# Patient Record
Sex: Female | Born: 1945 | Race: White | Hispanic: No | Marital: Married | Smoking: Never smoker
Health system: Southern US, Community
[De-identification: ages and names within clinical notes are randomized; demographics above are authoritative.]

## PROBLEM LIST (undated history)

## (undated) DIAGNOSIS — D510 Vitamin B12 deficiency anemia due to intrinsic factor deficiency: Secondary | ICD-10-CM

## (undated) HISTORY — DX: Vitamin B12 deficiency anemia due to intrinsic factor deficiency: D51.0

---

## 1998-05-19 ENCOUNTER — Other Ambulatory Visit: Admission: RE | Admit: 1998-05-19 | Discharge: 1998-05-19 | Payer: Self-pay | Admitting: *Deleted

## 1999-08-02 ENCOUNTER — Other Ambulatory Visit: Admission: RE | Admit: 1999-08-02 | Discharge: 1999-08-02 | Payer: Self-pay | Admitting: *Deleted

## 2000-08-22 ENCOUNTER — Encounter: Payer: Self-pay | Admitting: *Deleted

## 2000-08-22 ENCOUNTER — Encounter: Admission: RE | Admit: 2000-08-22 | Discharge: 2000-08-22 | Payer: Self-pay | Admitting: *Deleted

## 2000-09-05 ENCOUNTER — Other Ambulatory Visit: Admission: RE | Admit: 2000-09-05 | Discharge: 2000-09-05 | Payer: Self-pay | Admitting: *Deleted

## 2001-09-18 ENCOUNTER — Encounter: Payer: Self-pay | Admitting: *Deleted

## 2001-09-18 ENCOUNTER — Encounter: Admission: RE | Admit: 2001-09-18 | Discharge: 2001-09-18 | Payer: Self-pay | Admitting: *Deleted

## 2001-10-02 ENCOUNTER — Other Ambulatory Visit: Admission: RE | Admit: 2001-10-02 | Discharge: 2001-10-02 | Payer: Self-pay | Admitting: *Deleted

## 2002-11-05 ENCOUNTER — Encounter: Admission: RE | Admit: 2002-11-05 | Discharge: 2002-11-05 | Payer: Self-pay | Admitting: *Deleted

## 2002-11-05 ENCOUNTER — Encounter: Payer: Self-pay | Admitting: *Deleted

## 2002-11-18 ENCOUNTER — Other Ambulatory Visit: Admission: RE | Admit: 2002-11-18 | Discharge: 2002-11-18 | Payer: Self-pay | Admitting: *Deleted

## 2004-01-05 ENCOUNTER — Encounter: Admission: RE | Admit: 2004-01-05 | Discharge: 2004-01-05 | Payer: Self-pay | Admitting: *Deleted

## 2004-01-20 ENCOUNTER — Other Ambulatory Visit: Admission: RE | Admit: 2004-01-20 | Discharge: 2004-01-20 | Payer: Self-pay | Admitting: *Deleted

## 2005-02-08 ENCOUNTER — Encounter: Admission: RE | Admit: 2005-02-08 | Discharge: 2005-02-08 | Payer: Self-pay | Admitting: *Deleted

## 2005-02-22 ENCOUNTER — Other Ambulatory Visit: Admission: RE | Admit: 2005-02-22 | Discharge: 2005-02-22 | Payer: Self-pay | Admitting: *Deleted

## 2006-04-04 ENCOUNTER — Encounter: Admission: RE | Admit: 2006-04-04 | Discharge: 2006-04-04 | Payer: Self-pay | Admitting: *Deleted

## 2006-04-18 ENCOUNTER — Other Ambulatory Visit: Admission: RE | Admit: 2006-04-18 | Discharge: 2006-04-18 | Payer: Self-pay | Admitting: *Deleted

## 2006-06-27 ENCOUNTER — Other Ambulatory Visit: Admission: RE | Admit: 2006-06-27 | Discharge: 2006-06-27 | Payer: Self-pay | Admitting: *Deleted

## 2007-01-09 ENCOUNTER — Other Ambulatory Visit: Admission: RE | Admit: 2007-01-09 | Discharge: 2007-01-09 | Payer: Self-pay | Admitting: *Deleted

## 2007-03-13 ENCOUNTER — Inpatient Hospital Stay (HOSPITAL_COMMUNITY): Admission: EM | Admit: 2007-03-13 | Discharge: 2007-03-17 | Payer: Self-pay | Admitting: Gastroenterology

## 2007-03-13 ENCOUNTER — Ambulatory Visit: Payer: Self-pay | Admitting: Oncology

## 2007-06-05 ENCOUNTER — Encounter: Admission: RE | Admit: 2007-06-05 | Discharge: 2007-06-05 | Payer: Self-pay | Admitting: *Deleted

## 2007-06-26 ENCOUNTER — Other Ambulatory Visit: Admission: RE | Admit: 2007-06-26 | Discharge: 2007-06-26 | Payer: Self-pay | Admitting: *Deleted

## 2007-08-07 ENCOUNTER — Ambulatory Visit: Payer: Self-pay | Admitting: Oncology

## 2008-07-15 ENCOUNTER — Encounter: Admission: RE | Admit: 2008-07-15 | Discharge: 2008-07-15 | Payer: Self-pay | Admitting: Gynecology

## 2008-07-29 ENCOUNTER — Other Ambulatory Visit: Admission: RE | Admit: 2008-07-29 | Discharge: 2008-07-29 | Payer: Self-pay | Admitting: Gynecology

## 2009-10-14 ENCOUNTER — Encounter: Admission: RE | Admit: 2009-10-14 | Discharge: 2009-10-14 | Payer: Self-pay | Admitting: Internal Medicine

## 2010-11-09 ENCOUNTER — Encounter
Admission: RE | Admit: 2010-11-09 | Discharge: 2010-11-09 | Payer: Self-pay | Source: Home / Self Care | Attending: Internal Medicine | Admitting: Internal Medicine

## 2010-11-14 IMAGING — MG MM SCREEN MAMMOGRAM BILATERAL
5 series · 5 of 5 positions shown · non-contrast
Comparison: none

DG SCREEN MAMMOGRAM BILATERAL
Bilateral CC and MLO view(s) were taken.

DIGITAL SCREENING MAMMOGRAM WITH CAD:
There are scattered fibroglandular densities.  No masses or malignant type calcifications are 
identified.  Compared with prior studies.
Images were processed with CAD.

[R CC]
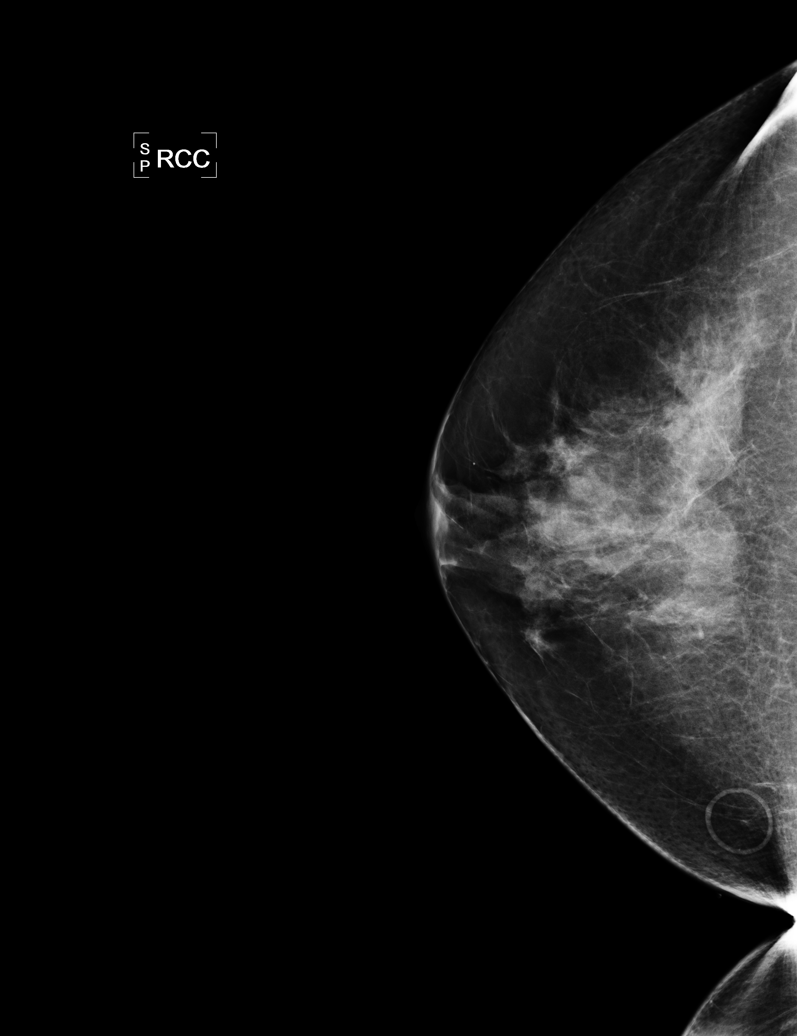

[L CC]
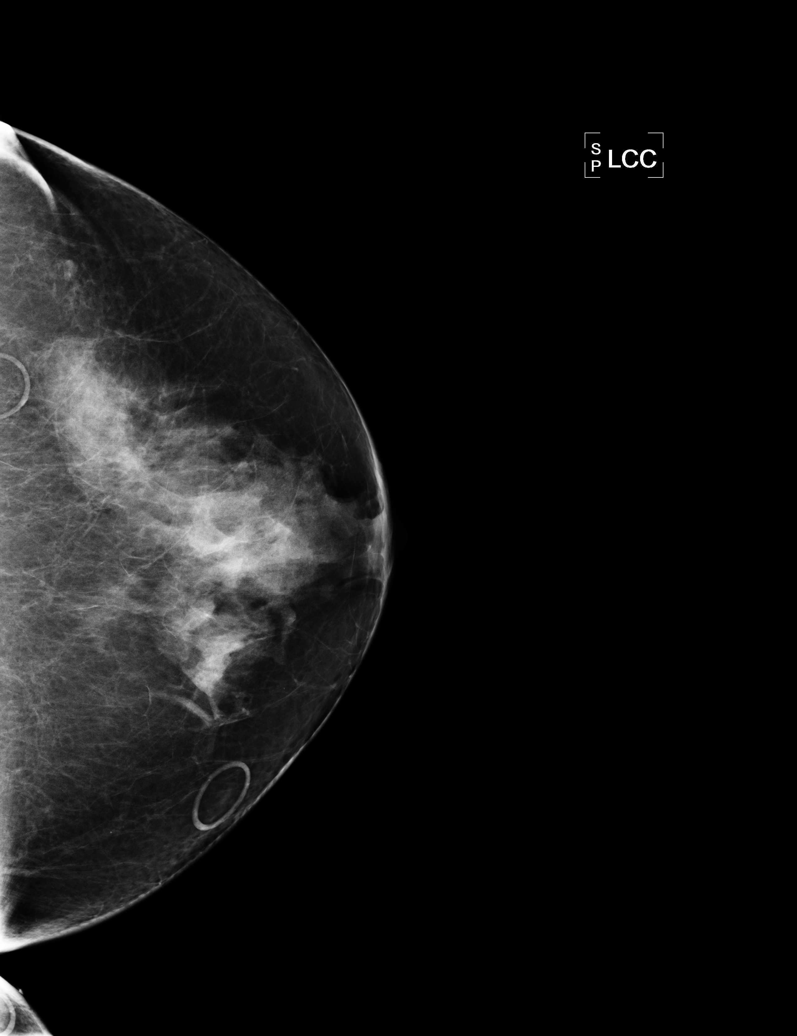

[L MLO]
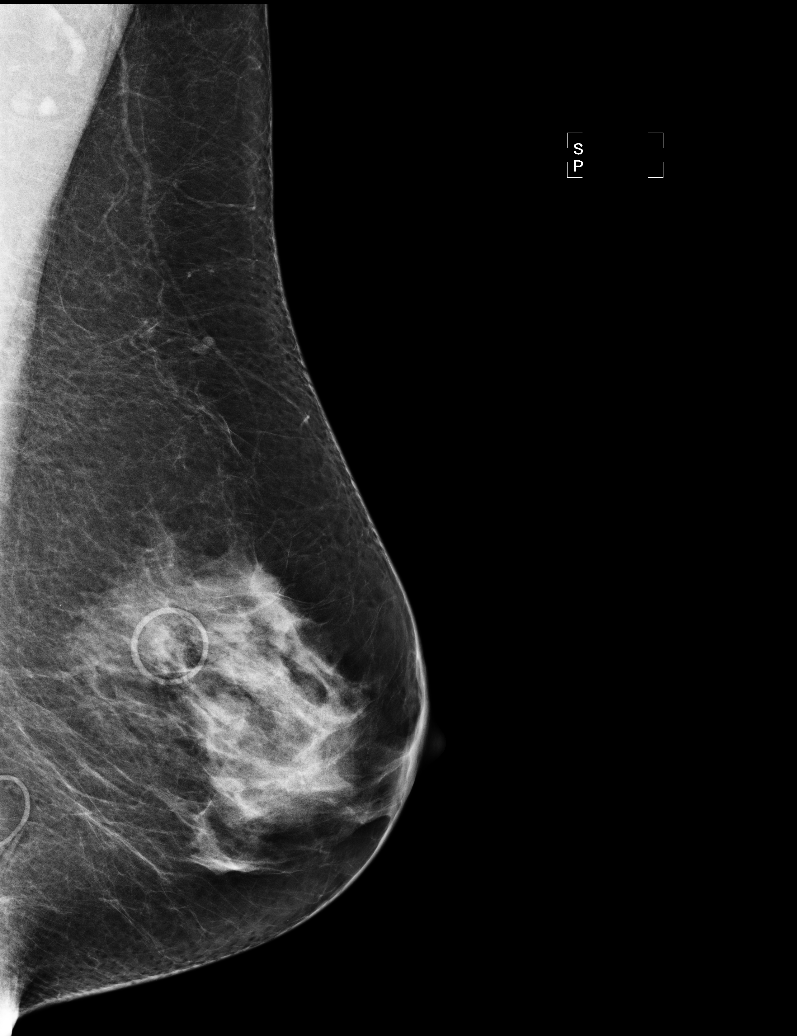

[R MLO (1 of 2)]
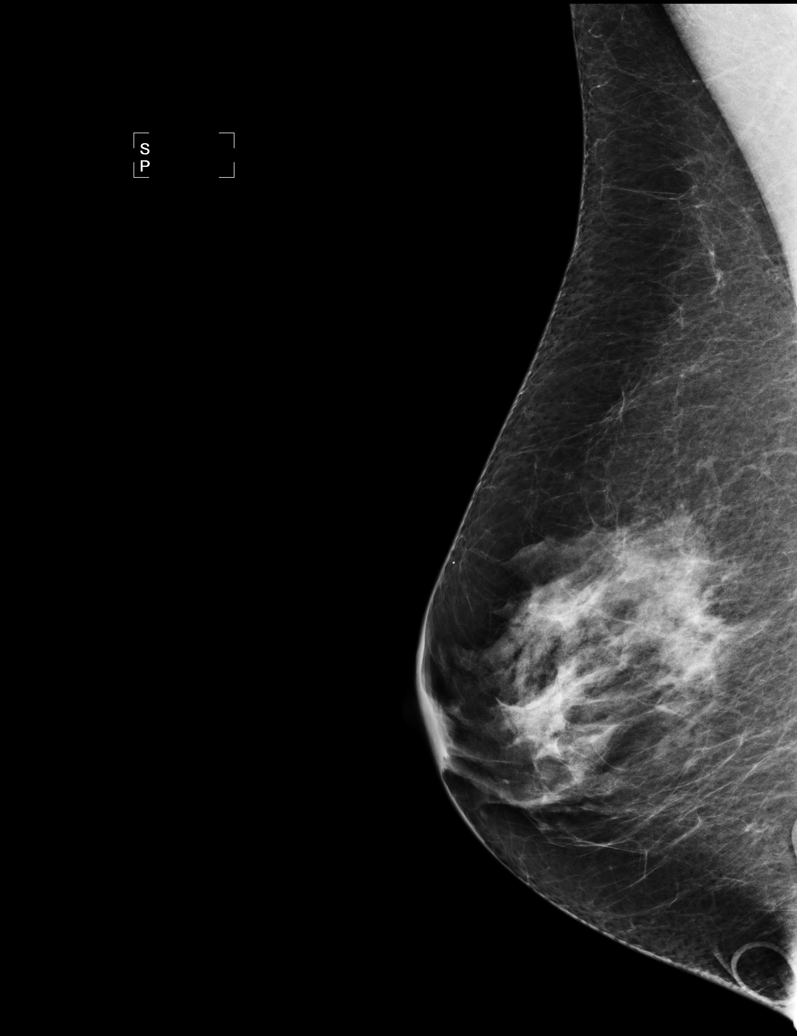

[R MLO (2 of 2)]
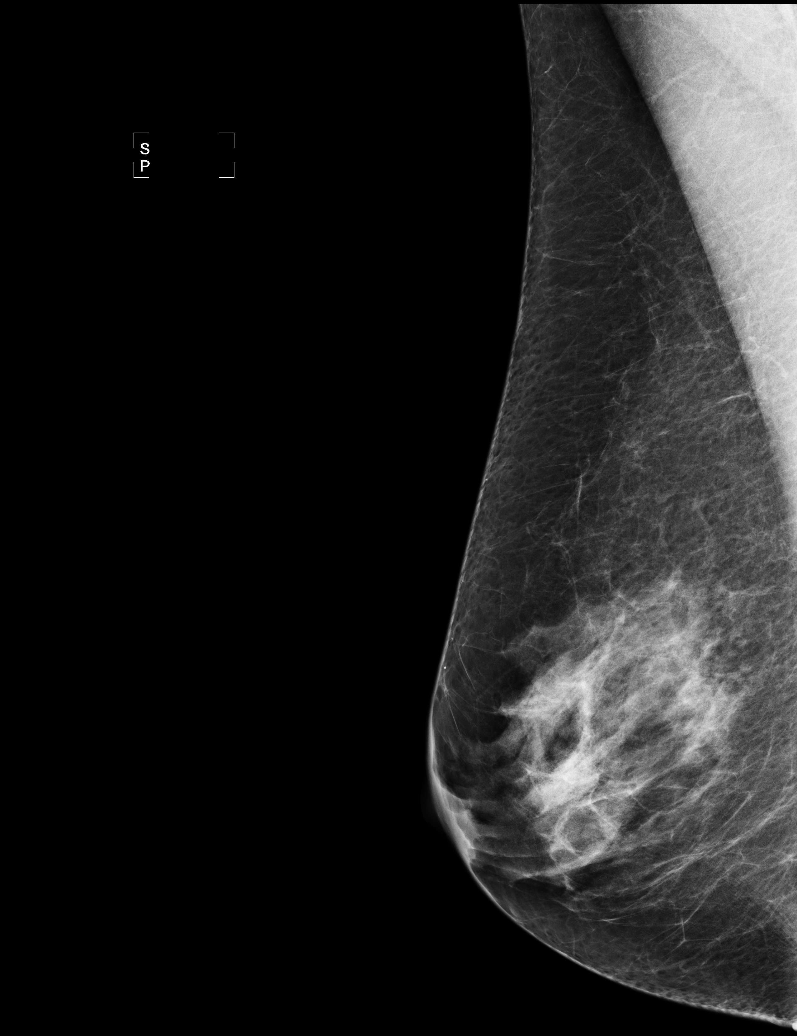

[5 of 5 positions shown; findings below may reference images not displayed]

IMPRESSION: No specific mammographic evidence of malignancy.  Next screening mammogram is recommended in one 
year.

A result letter of this screening mammogram will be mailed directly to the patient.

ASSESSMENT: Negative - BI-RADS 1

Screening mammogram in 1 year.
,

## 2011-04-20 NOTE — Discharge Summary (Signed)
NAME:  Ann Curtis, Ann Curtis             ACCOUNT NO.:  [id number]   MEDICAL RECORD NO.:  [id number]          PATIENT TYPE:  INP   LOCATION:  1315                         FACILITY:  [facility]   PHYSICIAN:  Lennis P. Darrold Span, M.D.DATE OF BIRTH:  03-May-1946   DATE OF ADMISSION:  03/13/2007  DATE OF DISCHARGE:  03/17/2007                               DISCHARGE SUMMARY   HISTORY OF PRESENT ILLNESS:  The patient is a very pleasant 65 year old  lady admitted by Dr. Herbert Moors with about 3 weeks of extreme weakness,  jaundice and hemoglobin of 5.2.  for details of presentation and history  available at that point, see dictated H and P from 03/13/2007.   REVIEW OF SYSTEMS/PAST MEDICAL HISTORY/FAMILY AND SOCIAL HISTORY:  As in  the dictated H and P.   PHYSICAL EXAMINATION ON ADMISSION:  VITAL SIGNS:  Weight 124, blood  pressure 140/60, heart rate 100, temperature 98.3.  GENERAL:  She appeared, pale, jaundiced.  Not in acute distress.  NECK:  Supple without masses or adenopathy, no goiter.  HEART:  Regular rate and rhythm without murmurs, rubs or gallops.  LUNGS:  Clear.  ABDOMEN:  Normal bowel sounds.  Soft and nontender.  No  hepatosplenomegaly.  RECTAL:  Exam had brown stool, heme-neg, no masses.  EXTREMITIES:  No edema.   HOSPITAL COURSE:  The patient was admitted to the oncology unit,  initially to GI service and subsequently transferred to  hematology/oncology service.  She was seen in consultation on the night  of admission by Dr. Arlan Organ.  Pertinent information from Dr.  Lonell Face consult: a 2 to 3 month history of some muscle aches,  progressive fatigue, red and uncomfortable throat, uncomfortable tongue.  She had no fever, bleeding, change in bowels, cough or chills.  Additional history obtained over the next couple of days included  tingling initially in her feet and then in her hands to the point that  it was difficult for her to hold a pen to write, lack of taste for at  least 3 weeks with very poor appetite during that time and weight loss  of about 10 pounds.  More environmental allergy symptoms with drainage  and congestion adding to some nausea in the past few weeks.  She had  been eating a balanced diet until about the past 3 weeks.  No pain and  no pruritus.   LABORATORY DATA:  Laboratories from Dr. Jimmy Footman office, just prior  to meeting with Dr. Evette Cristal, included hemoglobin 5.2, MCV 127, platelets  100,000, sed rate 107, bilirubin 6.8, AST 157, ALT 43.  Hepatitis  negative, HIV nonreactive, mono screen negative, hepatitis B surface  antibodies negative.  In hospital 03/13/2007:  White count 5.9,  hemoglobin 4.8 with hematocrit of 13.6, MCV 129.9, RDW 23.8, platelets  100,000.  ANC 3.7, reticulocyte 3% with repeat 2.5%.  polychromasia,  basophilic stippling, marked poikilocytosis and tear-drop cells present  as well as ovalocytes and some atypical lymphs called.  sodium 141,  potassium 4.2, chloride 106, CO2 21, glucose 109, BUN 17, creatinine  0.8, total protein 6.2, albumin 4, AST 156, ALT 37,  alk phos 84, total  bilirubin 6.6, LDH 8686.  TSH 4.8, serum iron 269.  B12 75.  RBC folate  499.  CA-125 was 15.4, haptoglobin on 03/13/2007 was less than 6.  Urinalysis had specific gravity 1.010, pH of 7, small hemoglobin,  urobilinogen 2, zero to 2 white cells, rare bacteria.  Mono screen was  negative.  Mycoplasma was negative at less than 0.8.   The patient was transfused packed red blood cells on the night of  admission, 2 units with CBC draw close after completion of the  transfusion showing a hemoglobin of 8.1 on 03/14/2007.  However, follow-  up CBC on 03/15/2007, which was probably more accurate had hemoglobin of  6.2.  She was transfused additional 2 units of packed red blood cells on  03/15/2007 without difficulty.  With results of the lab work as above  available, particularly the extremely low B12, it appeared that this was  etiology of  all of the presenting complaints.  She was begun on B12 1000  mcg IM daily on 03/14/2007, this given 04/11, 04/12, 04/13 and  04/14//2008.  A repeat B12 panel was ordered on 03/14/2007, however this  unfortunately was drawn just shortly after the B12 injection was given  and those results are probably not accurate.  We have ordered  separately, parietal cell antibodies, which is still pending at the time  of this dictation.   The patient felt progressively better during her hospital stay,  particularly after she was transfused up to a hemoglobin of 8.8 to 9.  She did have some mild hypokalemia that corrected easily with  supplementation and will be discharged to home on some oral potassium,  at least initially.  This is to be followed up by Dr. Gilman Buttner.  She had  some mild environmental symptoms in hospitalization and improved with  Flonase.  She had no significant bleeding or bruising, even with  platelet count that decreased to a range of 48 to 60,000 over this  several days prior to discharge.   By the time of discharge, she was walking easily in the halls and  appetite was much improved with improvement already in her taste.  She  was still experiencing tingling in her hands and feet, which actually  had seemed somewhat more pronounced over the past couple of days, but is  also felt related to the B12 deficiency.   As the patient lives in [place] with her husband, we have made  arrangements for her to be followed by Dr. Gery Pray at [facility].  The patient is scheduled to continue outpatient B12  injections there and for consultation visit with Dr. Gilman Buttner this week.   Labs on 03/17/2007 morning, hemoglobin 9, which is up from 8.8 on  03/16/2007, white count 3.8, ANC 1.6, platelets essentially stable at  48,000, sodium 145, potassium 4.1 after 60 mEq of potassium yesterday, plus some potassium in her IV fluid.  Chloride 115, CO2 25, glucose 100,  BUN 6,  creatinine 0.5, total bili 2.5, alk phos 105, GOT 48, GPT 27,  total protein 5.2, albumin 3.2, calcium 9.3, LDH 5648.   DISCHARGE MEDICATIONS:  Calcium with vitamin D 1200 mg daily, magnesium  125 mg twice daily as needed with the calcium, potassium 20 mEq daily  for 10 days then per Dr. Gilman Buttner, Nasonex 1 spray daily as needed for  allergies, saline nose spray several times day for nose dry.  She is to  hold her Estrace 0.5  mg daily (which she has actually been holding since  prior to admission, and was not given in hospital) and discuss this with  Dr. Randell Patient.  She is to hold her aspirin 325 mg daily, until her platelets  are improved and this is okayed by Dr. Gilman Buttner.  She can use p.r.n.  Tylenol.   DISCHARGE INSTRUCTIONS:  Diet is without restrictions and we have  recommended Carnation Instant Breakfast 1 or 2 daily.  When applicable  she is to increase her activity slowly, and I have asked her not to  drive at least until she is feeling strong and this is okayed by Dr.  Gilman Buttner.  She will have B12 injection at [facility] on  03/19/2007 and see Dr. Gilman Buttner on 03/20/2007.  She will need to follow  up with Dr. Randell Patient per his office.  She will also need to follow up with  Dr. Evette Cristal in the next 1 to 2 months.  She has never had screening  colonoscopy and she may be appropriate also for upper endoscopy.   FINAL DIAGNOSES:  1. Severe B12 deficiency with marked macrocytic anemia,      thrombocytopenia, some leukopenia, morphology changes on peripheral      smear, glossitis, weight loss, neuropathy symptoms, elevated      bilirubin and LDH.  Patient has begun loading dose and will      continue this outpatient per Dr. Gilman Buttner.  Then I expect long-term      maintenance B12.  Parietal cell are still pending.  2. Mild hypokalemia related to depleting of B12.  Potassium      supplements as above.  3. Poor nutritional status related to poor oral intake prior to      admission (seen in  hospital by dietitian; will follow      recommendations including Carnation Instant).  4. Environmental allergies.  Better on Nasonex.  5. She will call if any questions or problems, including any bleeding.      Lennis P. Darrold Span, M.D.  Electronically Signed     LPL/MEDQ  D:  03/17/2007  T:  03/17/2007  Job:  [id number]   cc:   Dellia Beckwith, M.D.  Fax: [phone]   Graylin Shiver, M.D.  Fax: [phone]   Almedia Balls. Randell Patient, M.D.  Fax: [phone]   Lauretta I. Odogwu, M.D.  Fax: [phone]

## 2011-04-20 NOTE — H&P (Signed)
NAME:  Ann Curtis, Ann Curtis             ACCOUNT NO.:  [id number]   MEDICAL RECORD NO.:  [id number]          PATIENT TYPE:  INP   LOCATION:  1315                         FACILITY:  [facility]   PHYSICIAN:  Graylin Shiver, M.D.   DATE OF BIRTH:  10/02/1946   DATE OF ADMISSION:  03/13/2007  DATE OF DISCHARGE:                              HISTORY & PHYSICAL   REASON FOR ADMISSION:  The patient is a 65 year old female, who for the  past 3 weeks has been feeling weak, decreased energy and a little short  of breath.  She was sort of feeling like she had a virus, she thought.  She was also noticing that food, just in general, was not tasting good.  She has had some nausea as well as dry heaves.  She has lost about 8 or  10 pounds in the last 3 weeks.  She saw Dr. Randell Patient with these complaints,  and he felt that she looked pale and yellow.  Blood work was drawn, and  she was found to have a hemoglobin of 5.2 and a hematocrit of 15.3.  Her  MCV was elevated at 127.5, platelet count was 100,000.  Sed rate was  elevated at 107, bilirubin 6.8, alkaline phosphatase normal, AST 157,  ALT 43.  The patient was referred over here, because of the anemia and  jaundice.  The patient denies any evidence of any bleeding.  She denies  hematemesis, melena or hematochezia.  She has no complaints of abdominal  pain.  She denies drinking alcohol.  She gives no history of a liver  disease, hepatitis A antibody negative, HIV nonreactive, mono-screen  negative, hepatitis B surface antibody was negative.  These were  obtained yesterday.   The patient was seen in my office today with these complaints.   LABS:  Were reviewed, and she is being admitted to [facility].   PAST HISTORY:  ALLERGIES:  CODEINE CAUSES NAUSEA AND VOMITING.   CURRENT MEDICATIONS:  1. Estrace 1/2 mg daily.  2. Citracal 250 mg b.i.d.  3. Magnesium 250 mg half b.i.d.  4. Aspirin 325 mg, half q.a.m.   MEDICAL PROBLEMS:  No chronic medical  problems stated.   SURGERIES:  Past history of ovarian cancer, stage I, hysterectomy,  tonsillectomy.   SOCIAL HISTORY:  Does not smoke or drink alcohol   FAMILY HISTORY:  Noncontributory.   REVIEW OF SYSTEMS:  GENERAL:  Denies fever, chills has been feeling  weak, tired, run down.  No chest pain, some shortness of breath with  exertion.  No abdominal pain.  No change in bowel habits.  No GI  bleeding.   PHYSICAL:  VITAL SIGNS:  Weight 124, blood pressure 140/60, pulse of  100, temperature is 98.3.  GENERAL:  She appears pale, jaundiced, in no acute distress.  NECK:  Supple.  No masses, adenopathy or goiter.  HEART:  Regular rhythm.  No murmurs, gallops or rubs.  LUNGS:  Clear.  ABDOMEN:  Bowel sounds normal, soft nontender.  No hepatosplenomegaly.  EXTREMITIES:  No edema.  RECTAL:  No masses.  Stool brown and heme-negative.  IMPRESSION:  1. Anemia.  This anemia is macrocytic.  This could be from a B12 for a      folate deficiency.  This could also be a hemolytic anemia.  2. Jaundice.  This could be from a hemolytic anemia or some underlying      liver disease.   PLAN:  Because of the severe anemia, I feel this patient needs to be  admitted to the hospital.  She is going to be admitted to [facility].  Labs will be ordered.  She will be typed and crossed consultation with  hematology/oncology will be obtained.  I have spoken with Dr. Mancel Bale, and one of his colleagues on call this evening will see the  patient.  She may need a bone marrow examination.           ______________________________  Graylin Shiver, M.D.     SFG/MEDQ  D:  03/13/2007  T:  03/13/2007  Job:  [id number]   cc:   Almedia Balls. Randell Patient, M.D.  Fax: [phone]   Leighton Roach Truett Perna, M.D.  Fax: [phone]

## 2011-04-20 NOTE — Consult Note (Signed)
Ann Curtis, Ann Curtis             ACCOUNT NO.:  [id number]   MEDICAL RECORD NO.:  [id number]          PATIENT TYPE:  INP   LOCATION:  1315                         FACILITY:  [facility]   PHYSICIAN:  Lauretta I. Odogwu, M.D.DATE OF BIRTH:  28-Sep-1946   DATE OF CONSULTATION:  03/13/2007  DATE OF DISCHARGE:                                 CONSULTATION   REQUESTING PHYSICIAN:  Graylin Shiver, M.D.   REASON FOR CONSULTATION:  Anemia.   HISTORY OF PRESENT ILLNESS:  Ann Curtis is a 65 year old white female  admitted for evaluation of anemia.  The patient describes a 2 to 48-month  history of sore throat, muscle aches, flu-like symptoms, intermittent  acrocyanosis, as well as loss of appetite, and weight loss of 8-10  pounds for the last 3 weeks.  She also lacks energy.  She denies  hematemesis, hemoptysis, melena and hematochezia.  She also denies  cough, fevers, and chills.  She had labs drawn on Thursday at the office  of Dr. Randell Patient, which revealed a white blood cell count of 5.1, hemoglobin  of 5.2, hematocrit 15.3, and platelets of 100, MCV was 127.  AST 157,  ALT 43.   PAST MEDICAL HISTORY:  1. Stage I ovarian carcinoma, status post hysterectomy in 1986.  2. Tonsillectomy.   ALLERGIES:  CODEINE.   MEDICATIONS:  1. Estrace 0.5 mg.  2. Estrace vaginal cream.   FAMILY HISTORY:  Remarkable for father dying with massive MI.  The rest  of the family history is noncontributory.   SOCIAL HISTORY:  The patient is married.  She has one child.  She is a  [employer] trader.  No alcohol or tobacco history.   HEALTH MAINTENANCE:  She was active.  She was up to date with  immunizations.  She never had a colonoscopy.   PHYSICAL EXAMINATION:  GENERAL:  This is a pale, alert and oriented x1,  65 year old female.  VITAL SIGNS:  Pulse 96, blood pressure 112/67, temperature 99.5,  respirations 18, saturation 92% on room air.  HEENT:  Sclerae are icteric.  Mouth without thrush or oral lesions.  NECK:   Supple.  No cervical supraclavicular masses.  LUNGS:  Clear to auscultation and percussion.  CARDIOVASCULAR:  Regular rate and rhythm without murmurs, rubs, or  gallops.  ABDOMEN:  Soft nontender.  Bowel sounds x4.  No splenomegaly.  EXTREMITIES:  Without edema.  Pulses present and symmetrical.  CNS:  No focal deficit.  LYMPH NODES:  Negative for cervical, axillary, or inguinal adenopathy.   LABORATORY:  Pending.  Her peripheral blood smear shows elevated MCV.  There are reactive hyper  segmented white blood cells.   ASSESSMENT:  Dr. Dalene Carrow has seen and evaluated the patient and reviewed  the chart.  Etiology is yet to be determined as a full evaluation is  underway.  However, her history is suggestive of chronic type viral  illness versus nutritional deficiency versus a possible underlying  malignancy.   PLAN:  We will obtain retic count, direct Coombs, heptoglobin, TSH,  SPEP, mycoplasma, and infectious mononucleosis antibody screen, iron  studies, cold agglutinin titer, and LDH, C-MET,  folate and vitamin B12  levels, hepatitis panel.  We will also check a chest x-ray and  ultrasound of the liver.  The patient will be given warmed packed RBCs.   Thank you very much for allowing [place] the opportunity to participate in  the care of this nice patient.      Marlowe Kays, P.A.      Lauretta I. Odogwu, M.D.  Electronically Signed    SW/MEDQ  D:  03/14/2007  T:  03/14/2007  Job:  [id number]   cc:   Graylin Shiver, M.D.  Fax: [phone]

## 2011-10-12 ENCOUNTER — Other Ambulatory Visit: Payer: Self-pay | Admitting: Internal Medicine

## 2011-10-12 DIAGNOSIS — Z1231 Encounter for screening mammogram for malignant neoplasm of breast: Secondary | ICD-10-CM

## 2011-11-15 ENCOUNTER — Ambulatory Visit
Admission: RE | Admit: 2011-11-15 | Discharge: 2011-11-15 | Disposition: A | Discharge status: Home / Self Care | Payer: Medicare Other | Source: Ambulatory Visit | Attending: Internal Medicine | Admitting: Internal Medicine

## 2011-11-15 DIAGNOSIS — Z1231 Encounter for screening mammogram for malignant neoplasm of breast: Secondary | ICD-10-CM

## 2013-02-05 ENCOUNTER — Other Ambulatory Visit: Payer: Self-pay

## 2013-02-05 DIAGNOSIS — Z1231 Encounter for screening mammogram for malignant neoplasm of breast: Secondary | ICD-10-CM

## 2013-02-25 ENCOUNTER — Ambulatory Visit
Admission: RE | Admit: 2013-02-25 | Discharge: 2013-02-25 | Disposition: A | Discharge status: Home / Self Care | Payer: Medicare Other | Source: Ambulatory Visit

## 2013-02-25 DIAGNOSIS — Z1231 Encounter for screening mammogram for malignant neoplasm of breast: Secondary | ICD-10-CM

## 2013-02-26 ENCOUNTER — Other Ambulatory Visit: Payer: Self-pay | Admitting: Internal Medicine

## 2013-02-26 DIAGNOSIS — R928 Other abnormal and inconclusive findings on diagnostic imaging of breast: Secondary | ICD-10-CM

## 2013-03-10 ENCOUNTER — Ambulatory Visit
Admission: RE | Admit: 2013-03-10 | Discharge: 2013-03-10 | Disposition: A | Discharge status: Home / Self Care | Payer: Medicare Other | Source: Ambulatory Visit | Attending: Internal Medicine | Admitting: Internal Medicine

## 2013-03-10 DIAGNOSIS — R928 Other abnormal and inconclusive findings on diagnostic imaging of breast: Secondary | ICD-10-CM

## 2014-03-31 ENCOUNTER — Other Ambulatory Visit: Payer: Self-pay

## 2014-03-31 DIAGNOSIS — Z1231 Encounter for screening mammogram for malignant neoplasm of breast: Secondary | ICD-10-CM

## 2014-04-29 ENCOUNTER — Encounter (INDEPENDENT_AMBULATORY_CARE_PROVIDER_SITE_OTHER): Payer: Self-pay

## 2014-04-29 ENCOUNTER — Ambulatory Visit
Admission: RE | Admit: 2014-04-29 | Discharge: 2014-04-29 | Disposition: A | Discharge status: Home / Self Care | Payer: Medicare Other | Source: Ambulatory Visit

## 2014-04-29 DIAGNOSIS — Z1231 Encounter for screening mammogram for malignant neoplasm of breast: Secondary | ICD-10-CM

## 2015-06-28 ENCOUNTER — Other Ambulatory Visit: Payer: Self-pay

## 2015-06-28 DIAGNOSIS — Z1231 Encounter for screening mammogram for malignant neoplasm of breast: Secondary | ICD-10-CM

## 2015-07-27 ENCOUNTER — Ambulatory Visit
Admission: RE | Admit: 2015-07-27 | Discharge: 2015-07-27 | Disposition: A | Discharge status: Home / Self Care | Payer: Medicare Other | Source: Ambulatory Visit

## 2015-07-27 DIAGNOSIS — Z1231 Encounter for screening mammogram for malignant neoplasm of breast: Secondary | ICD-10-CM

## 2016-04-20 DIAGNOSIS — D510 Vitamin B12 deficiency anemia due to intrinsic factor deficiency: Secondary | ICD-10-CM | POA: Diagnosis not present

## 2016-07-21 ENCOUNTER — Other Ambulatory Visit: Payer: Self-pay | Admitting: Internal Medicine

## 2016-07-21 DIAGNOSIS — Z1231 Encounter for screening mammogram for malignant neoplasm of breast: Secondary | ICD-10-CM

## 2016-09-26 ENCOUNTER — Ambulatory Visit
Admission: RE | Admit: 2016-09-26 | Discharge: 2016-09-26 | Disposition: A | Discharge status: Home / Self Care | Payer: Medicare Other | Source: Ambulatory Visit | Attending: Internal Medicine | Admitting: Internal Medicine

## 2016-09-26 DIAGNOSIS — Z1231 Encounter for screening mammogram for malignant neoplasm of breast: Secondary | ICD-10-CM

## 2017-01-01 ENCOUNTER — Other Ambulatory Visit: Payer: Self-pay | Admitting: Internal Medicine

## 2017-01-01 DIAGNOSIS — Z1231 Encounter for screening mammogram for malignant neoplasm of breast: Secondary | ICD-10-CM

## 2017-12-17 ENCOUNTER — Other Ambulatory Visit: Payer: Self-pay | Admitting: Internal Medicine

## 2017-12-17 DIAGNOSIS — Z1231 Encounter for screening mammogram for malignant neoplasm of breast: Secondary | ICD-10-CM

## 2018-01-08 ENCOUNTER — Ambulatory Visit
Admission: RE | Admit: 2018-01-08 | Discharge: 2018-01-08 | Disposition: A | Discharge status: Home / Self Care | Payer: Medicare Other | Source: Ambulatory Visit | Attending: Internal Medicine | Admitting: Internal Medicine

## 2018-01-08 DIAGNOSIS — Z1231 Encounter for screening mammogram for malignant neoplasm of breast: Secondary | ICD-10-CM

## 2018-04-21 DIAGNOSIS — D510 Vitamin B12 deficiency anemia due to intrinsic factor deficiency: Secondary | ICD-10-CM | POA: Diagnosis not present

## 2019-01-06 ENCOUNTER — Other Ambulatory Visit: Payer: Self-pay | Admitting: Internal Medicine

## 2019-01-06 DIAGNOSIS — Z1231 Encounter for screening mammogram for malignant neoplasm of breast: Secondary | ICD-10-CM

## 2019-03-11 ENCOUNTER — Ambulatory Visit: Payer: Medicare Other

## 2019-09-24 ENCOUNTER — Ambulatory Visit: Payer: Medicare Other

## 2019-09-30 ENCOUNTER — Other Ambulatory Visit: Payer: Self-pay

## 2019-09-30 ENCOUNTER — Ambulatory Visit
Admission: RE | Admit: 2019-09-30 | Discharge: 2019-09-30 | Disposition: A | Discharge status: Home / Self Care | Payer: Medicare Other | Source: Ambulatory Visit | Attending: Internal Medicine | Admitting: Internal Medicine

## 2019-09-30 DIAGNOSIS — Z1231 Encounter for screening mammogram for malignant neoplasm of breast: Secondary | ICD-10-CM

## 2020-04-20 DIAGNOSIS — D510 Vitamin B12 deficiency anemia due to intrinsic factor deficiency: Secondary | ICD-10-CM

## 2020-09-11 ENCOUNTER — Encounter: Payer: Self-pay | Admitting: Pharmacist

## 2020-09-11 DIAGNOSIS — E538 Deficiency of other specified B group vitamins: Secondary | ICD-10-CM | POA: Insufficient documentation

## 2020-09-21 ENCOUNTER — Inpatient Hospital Stay: Payer: Medicare Other | Attending: Oncology

## 2020-09-21 ENCOUNTER — Other Ambulatory Visit: Payer: Self-pay

## 2020-09-21 VITALS — BP 164/75 | HR 73 | Temp 99.0°F | Resp 18

## 2020-09-21 DIAGNOSIS — E538 Deficiency of other specified B group vitamins: Secondary | ICD-10-CM | POA: Diagnosis not present

## 2020-09-21 MED ORDER — CYANOCOBALAMIN 1000 MCG/ML IJ SOLN
INTRAMUSCULAR | Status: AC
  Filled 2020-09-21: qty 1

## 2020-09-21 MED ORDER — CYANOCOBALAMIN 1000 MCG/ML IJ SOLN
1000.0000 ug | Freq: Once | INTRAMUSCULAR | Status: AC
Start: 2020-09-21 — End: 2020-09-21
  Administered 2020-09-21: 1000 ug via INTRAMUSCULAR

## 2020-09-21 NOTE — Patient Instructions (Signed)
Vitamin B12 Deficiency Vitamin B12 deficiency occurs when the body does not have enough vitamin B12, which is an important vitamin. The body needs this vitamin:  To make red blood cells.  To make DNA. This is the genetic material inside cells.  To help the nerves work properly so they can carry messages from the brain to the body. Vitamin B12 deficiency can cause various health problems, such as a low red blood cell count (anemia) or nerve damage. What are the causes? This condition may be caused by:  Not eating enough foods that contain vitamin B12.  Not having enough stomach acid and digestive fluids to properly absorb vitamin B12 from the food that you eat.  Certain digestive system diseases that make it hard to absorb vitamin B12. These diseases include Crohn's disease, chronic pancreatitis, and cystic fibrosis.  A condition in which the body does not make enough of a protein (intrinsic factor), resulting in too few red blood cells (pernicious anemia).  Having a surgery in which part of the stomach or small intestine is removed.  Taking certain medicines that make it hard for the body to absorb vitamin B12. These medicines include: ? Heartburn medicines (antacids and proton pump inhibitors). ? Certain antibiotic medicines. ? Some medicines that are used to treat diabetes, tuberculosis, gout, or high cholesterol. What increases the risk? The following factors may make you more likely to develop a B12 deficiency:  Being older than age 50.  Eating a vegetarian or vegan diet, especially while you are pregnant.  Eating a poor diet while you are pregnant.  Taking certain medicines.  Having alcoholism. What are the signs or symptoms? In some cases, there are no symptoms of this condition. If the condition leads to anemia or nerve damage, various symptoms can occur, such as:  Weakness.  Fatigue.  Loss of appetite.  Weight loss.  Numbness or tingling in your hands and  feet.  Redness and burning of the tongue.  Confusion or memory problems.  Depression.  Sensory problems, such as color blindness, ringing in the ears, or loss of taste.  Diarrhea or constipation.  Trouble walking. If anemia is severe, symptoms can include:  Shortness of breath.  Dizziness.  Rapid heart rate (tachycardia). How is this diagnosed? This condition may be diagnosed with a blood test to measure the level of vitamin B12 in your blood. You may also have other tests, including:  A group of tests that measure certain characteristics of blood cells (complete blood count, CBC).  A blood test to measure intrinsic factor.  A procedure where a thin tube with a camera on the end is used to look into your stomach or intestines (endoscopy). Other tests may be needed to discover the cause of B12 deficiency. How is this treated? Treatment for this condition depends on the cause. This condition may be treated by:  Changing your eating and drinking habits, such as: ? Eating more foods that contain vitamin B12. ? Drinking less alcohol or no alcohol.  Getting vitamin B12 injections.  Taking vitamin B12 supplements. Your health care provider will tell you which dosage is best for you. Follow these instructions at home: Eating and drinking   Eat lots of healthy foods that contain vitamin B12, including: ? Meats and poultry. This includes beef, pork, chicken, turkey, and organ meats, such as liver. ? Seafood. This includes clams, rainbow trout, salmon, tuna, and haddock. ? Eggs. ? Cereal and dairy products that are fortified. This means that vitamin B12   has been added to the food. Check the label on the package to see if the food is fortified. The items listed above may not be a complete list of recommended foods and beverages. Contact a dietitian for more information. General instructions  Get any injections that are prescribed by your health care provider.  Take  supplements only as told by your health care provider. Follow the directions carefully.  Do not drink alcohol if your health care provider tells you not to. In some cases, you may only be asked to limit alcohol use.  Keep all follow-up visits as told by your health care provider. This is important. Contact a health care provider if:  Your symptoms come back. Get help right away if you:  Develop shortness of breath.  Have a rapid heart rate.  Have chest pain.  Become dizzy or lose consciousness. Summary  Vitamin B12 deficiency occurs when the body does not have enough vitamin B12.  The main causes of vitamin B12 deficiency include dietary deficiency, digestive diseases, pernicious anemia, and having a surgery in which part of the stomach or small intestine is removed.  In some cases, there are no symptoms of this condition. If the condition leads to anemia or nerve damage, various symptoms can occur, such as weakness, shortness of breath, and numbness.  Treatment may include getting vitamin B12 injections or taking vitamin B12 supplements. Eat lots of healthy foods that contain vitamin B12. This information is not intended to replace advice given to you by your health care provider. Make sure you discuss any questions you have with your health care provider. Document Revised: 05/08/2019 Document Reviewed: 07/29/2018 Elsevier Patient Education  2020 [employer]  

## 2020-09-21 NOTE — Progress Notes (Signed)
PT IS STABLE AT DISCHARGE. 

## 2020-10-19 ENCOUNTER — Other Ambulatory Visit: Payer: Self-pay | Admitting: Pharmacist

## 2020-10-24 ENCOUNTER — Other Ambulatory Visit: Payer: Self-pay

## 2020-10-24 ENCOUNTER — Inpatient Hospital Stay: Payer: Medicare Other | Attending: Oncology

## 2020-10-24 VITALS — BP 145/62 | HR 70 | Temp 98.2°F | Resp 18 | Ht 66.0 in | Wt 143.2 lb

## 2020-10-24 DIAGNOSIS — E538 Deficiency of other specified B group vitamins: Secondary | ICD-10-CM | POA: Insufficient documentation

## 2020-10-24 DIAGNOSIS — Z79899 Other long term (current) drug therapy: Secondary | ICD-10-CM | POA: Diagnosis not present

## 2020-10-24 MED ORDER — CYANOCOBALAMIN 1000 MCG/ML IJ SOLN
1000.0000 ug | Freq: Once | INTRAMUSCULAR | Status: AC
  Administered 2020-10-24: 1000 ug via INTRAMUSCULAR

## 2020-10-24 MED ORDER — CYANOCOBALAMIN 1000 MCG/ML IJ SOLN
INTRAMUSCULAR | Status: AC
  Filled 2020-10-24: qty 1

## 2020-10-24 NOTE — Progress Notes (Signed)
1455:PT STABLE AT TIME OF DISCHARGE ?

## 2020-10-24 NOTE — Patient Instructions (Signed)

## 2020-11-15 NOTE — Progress Notes (Signed)
PT STABLE AT TIME OF DISCHARGE 

## 2020-11-21 ENCOUNTER — Other Ambulatory Visit: Payer: Self-pay

## 2020-11-21 ENCOUNTER — Inpatient Hospital Stay: Payer: Medicare Other | Attending: Oncology

## 2020-11-21 VITALS — BP 139/77 | HR 67 | Temp 98.0°F | Resp 18 | Ht 66.0 in | Wt 137.8 lb

## 2020-11-21 DIAGNOSIS — E538 Deficiency of other specified B group vitamins: Secondary | ICD-10-CM

## 2020-11-21 DIAGNOSIS — Z79899 Other long term (current) drug therapy: Secondary | ICD-10-CM | POA: Insufficient documentation

## 2020-11-21 MED ORDER — CYANOCOBALAMIN 1000 MCG/ML IJ SOLN
INTRAMUSCULAR | Status: AC
  Filled 2020-11-21: qty 1

## 2020-11-21 MED ORDER — CYANOCOBALAMIN 1000 MCG/ML IJ SOLN
1000.0000 ug | Freq: Once | INTRAMUSCULAR | Status: AC
  Administered 2020-11-21: 1000 ug via INTRAMUSCULAR

## 2020-11-21 NOTE — Patient Instructions (Signed)

## 2020-12-09 NOTE — Progress Notes (Signed)
PT STABLE AT TIME OF DISCHARGE 

## 2020-12-22 ENCOUNTER — Other Ambulatory Visit: Payer: Self-pay | Admitting: Pharmacist

## 2020-12-28 ENCOUNTER — Inpatient Hospital Stay: Payer: Medicare Other | Attending: Oncology

## 2020-12-28 ENCOUNTER — Other Ambulatory Visit: Payer: Self-pay

## 2020-12-28 VITALS — BP 150/78 | HR 76 | Temp 97.9°F | Resp 18 | Ht 66.0 in | Wt 145.8 lb

## 2020-12-28 DIAGNOSIS — Z79899 Other long term (current) drug therapy: Secondary | ICD-10-CM | POA: Insufficient documentation

## 2020-12-28 DIAGNOSIS — E538 Deficiency of other specified B group vitamins: Secondary | ICD-10-CM | POA: Diagnosis not present

## 2020-12-28 MED ORDER — CYANOCOBALAMIN 1000 MCG/ML IJ SOLN
1000.0000 ug | Freq: Once | INTRAMUSCULAR | Status: AC
  Administered 2020-12-28: 1000 ug via INTRAMUSCULAR

## 2020-12-28 MED ORDER — CYANOCOBALAMIN 1000 MCG/ML IJ SOLN
INTRAMUSCULAR | Status: AC
  Filled 2020-12-28: qty 1

## 2020-12-28 NOTE — Patient Instructions (Signed)

## 2021-01-25 ENCOUNTER — Ambulatory Visit: Payer: Medicare Other

## 2021-01-27 ENCOUNTER — Other Ambulatory Visit: Payer: Self-pay

## 2021-01-27 ENCOUNTER — Inpatient Hospital Stay: Payer: Medicare Other | Attending: Oncology

## 2021-01-27 VITALS — BP 159/77 | HR 81 | Temp 98.6°F | Resp 18 | Ht 66.0 in | Wt 142.8 lb

## 2021-01-27 DIAGNOSIS — E538 Deficiency of other specified B group vitamins: Secondary | ICD-10-CM | POA: Insufficient documentation

## 2021-01-27 MED ORDER — CYANOCOBALAMIN 1000 MCG/ML IJ SOLN
1000.0000 ug | Freq: Once | INTRAMUSCULAR | Status: AC
  Administered 2021-01-27: 1000 ug via INTRAMUSCULAR

## 2021-01-27 MED ORDER — CYANOCOBALAMIN 1000 MCG/ML IJ SOLN
INTRAMUSCULAR | Status: AC
  Filled 2021-01-27: qty 1

## 2021-01-27 NOTE — Progress Notes (Signed)
PT STABLE AT TIME OF DISCHARGE 

## 2021-01-27 NOTE — Patient Instructions (Signed)

## 2021-01-31 ENCOUNTER — Other Ambulatory Visit: Payer: Self-pay | Admitting: Internal Medicine

## 2021-01-31 DIAGNOSIS — Z1231 Encounter for screening mammogram for malignant neoplasm of breast: Secondary | ICD-10-CM

## 2021-02-16 ENCOUNTER — Other Ambulatory Visit: Payer: Self-pay | Admitting: Pharmacist

## 2021-02-22 ENCOUNTER — Inpatient Hospital Stay: Payer: Medicare Other | Attending: Oncology

## 2021-02-22 ENCOUNTER — Other Ambulatory Visit: Payer: Self-pay

## 2021-02-22 VITALS — BP 159/72 | HR 67 | Temp 97.8°F | Resp 18 | Ht 66.0 in | Wt 142.5 lb

## 2021-02-22 DIAGNOSIS — Z79899 Other long term (current) drug therapy: Secondary | ICD-10-CM | POA: Diagnosis not present

## 2021-02-22 DIAGNOSIS — E538 Deficiency of other specified B group vitamins: Secondary | ICD-10-CM | POA: Insufficient documentation

## 2021-02-22 MED ORDER — CYANOCOBALAMIN 1000 MCG/ML IJ SOLN
1000.0000 ug | Freq: Once | INTRAMUSCULAR | Status: AC
  Administered 2021-02-22: 1000 ug via INTRAMUSCULAR

## 2021-02-22 MED ORDER — CYANOCOBALAMIN 1000 MCG/ML IJ SOLN
INTRAMUSCULAR | Status: AC
  Filled 2021-02-22: qty 1

## 2021-02-22 NOTE — Patient Instructions (Signed)

## 2021-03-22 ENCOUNTER — Other Ambulatory Visit: Payer: Self-pay

## 2021-03-22 ENCOUNTER — Inpatient Hospital Stay: Payer: Medicare Other | Attending: Oncology

## 2021-03-22 VITALS — BP 150/79 | HR 69 | Temp 98.5°F | Resp 18 | Ht 66.0 in | Wt 146.2 lb

## 2021-03-22 DIAGNOSIS — Z79899 Other long term (current) drug therapy: Secondary | ICD-10-CM | POA: Insufficient documentation

## 2021-03-22 DIAGNOSIS — E538 Deficiency of other specified B group vitamins: Secondary | ICD-10-CM

## 2021-03-22 MED ORDER — CYANOCOBALAMIN 1000 MCG/ML IJ SOLN
1000.0000 ug | Freq: Once | INTRAMUSCULAR | Status: AC
  Administered 2021-03-22: 1000 ug via INTRAMUSCULAR

## 2021-03-22 MED ORDER — CYANOCOBALAMIN 1000 MCG/ML IJ SOLN
INTRAMUSCULAR | Status: AC
  Filled 2021-03-22: qty 1

## 2021-03-22 NOTE — Patient Instructions (Signed)

## 2021-03-27 ENCOUNTER — Ambulatory Visit
Admission: RE | Admit: 2021-03-27 | Discharge: 2021-03-27 | Disposition: A | Discharge status: Home / Self Care | Payer: Medicare Other | Source: Ambulatory Visit | Attending: Internal Medicine | Admitting: Internal Medicine

## 2021-03-27 ENCOUNTER — Other Ambulatory Visit: Payer: Self-pay

## 2021-03-27 DIAGNOSIS — Z1231 Encounter for screening mammogram for malignant neoplasm of breast: Secondary | ICD-10-CM

## 2021-04-20 ENCOUNTER — Inpatient Hospital Stay (INDEPENDENT_AMBULATORY_CARE_PROVIDER_SITE_OTHER): Payer: Medicare Other | Admitting: Hematology and Oncology

## 2021-04-20 ENCOUNTER — Encounter: Payer: Self-pay | Admitting: Hematology and Oncology

## 2021-04-20 ENCOUNTER — Inpatient Hospital Stay: Payer: Medicare Other | Attending: Oncology

## 2021-04-20 ENCOUNTER — Inpatient Hospital Stay: Payer: Medicare Other

## 2021-04-20 ENCOUNTER — Other Ambulatory Visit: Payer: Self-pay | Admitting: Hematology and Oncology

## 2021-04-20 ENCOUNTER — Other Ambulatory Visit: Payer: Self-pay

## 2021-04-20 ENCOUNTER — Telehealth: Payer: Self-pay | Admitting: Hematology and Oncology

## 2021-04-20 VITALS — BP 169/92 | HR 82 | Temp 98.8°F | Resp 16 | Ht 66.0 in | Wt 142.0 lb

## 2021-04-20 VITALS — BP 143/90 | HR 78 | Temp 99.1°F | Resp 18 | Ht 66.0 in | Wt 145.5 lb

## 2021-04-20 DIAGNOSIS — E538 Deficiency of other specified B group vitamins: Secondary | ICD-10-CM

## 2021-04-20 DIAGNOSIS — D509 Iron deficiency anemia, unspecified: Secondary | ICD-10-CM | POA: Insufficient documentation

## 2021-04-20 LAB — IRON AND TIBC
Iron: 27 ug/dL — ABNORMAL LOW (ref 28–170)
Saturation Ratios: 5 % — ABNORMAL LOW (ref 10.4–31.8)
TIBC: 566 ug/dL — ABNORMAL HIGH (ref 250–450)
UIBC: 539 ug/dL

## 2021-04-20 LAB — FERRITIN: Ferritin: 7 ng/mL — ABNORMAL LOW (ref 11–307)

## 2021-04-20 LAB — CBC AND DIFFERENTIAL
HCT: 35 — AB (ref 36–46)
Hemoglobin: 11.2 — AB (ref 12.0–16.0)
Neutrophils Absolute: 4.76
Platelets: 227 (ref 150–399)
WBC: 7.8

## 2021-04-20 LAB — BASIC METABOLIC PANEL
BUN: 12 (ref 4–21)
CO2: 23 — AB (ref 13–22)
Chloride: 107 (ref 99–108)
Creatinine: 0.6 (ref 0.5–1.1)
Glucose: 100
Potassium: 3.7 (ref 3.4–5.3)
Sodium: 138 (ref 137–147)

## 2021-04-20 LAB — COMPREHENSIVE METABOLIC PANEL
Albumin: 4.5 (ref 3.5–5.0)
Calcium: 9.4 (ref 8.7–10.7)

## 2021-04-20 LAB — HEPATIC FUNCTION PANEL
ALT: 14 (ref 7–35)
AST: 36 — AB (ref 13–35)
Alkaline Phosphatase: 106 (ref 25–125)
Bilirubin, Total: 0.6

## 2021-04-20 LAB — CBC: RBC: 4.53 (ref 3.87–5.11)

## 2021-04-20 MED ORDER — CYANOCOBALAMIN 1000 MCG/ML IJ SOLN
INTRAMUSCULAR | Status: AC
  Filled 2021-04-20: qty 1

## 2021-04-20 MED ORDER — CYANOCOBALAMIN 1000 MCG/ML IJ SOLN
1000.0000 ug | Freq: Once | INTRAMUSCULAR | Status: AC
  Administered 2021-04-20: 1000 ug via INTRAMUSCULAR

## 2021-04-20 NOTE — Patient Instructions (Signed)

## 2021-04-20 NOTE — Progress Notes (Signed)
[place]  [address] [place],  [place]  [address] [phone]  Clinic Day:  04/21/2021  Referring physician: Sharyon Medicus, MD   CHIEF COMPLAINT:  CC:   Pernicious anemia  Current Treatment:   Monthly B12 injections   HISTORY OF PRESENT ILLNESS:  Ann Curtis is a 75 y.o. female with a history of pernicious anemia diagnosed in April 2008.  This was so severe, it was almost mistaken for leukemia.  She had pancytopenia and was found to have an extremely low B12 level. Her pancytopenia resolved with B12 repletion.  She continues monthly B12 injections.  As she has a history of ovarian cancer, as well as a family history of ovarian and breast cancer, she underwent genetic testing for BRCA 1 and 2, at another institution in 2017, which did not reveal any clinically significant mutation.  INTERVAL HISTORY:  Ann Curtis is here today for repeat clinical assessment. She continues B12 monthly.  She denies progressive fatigue concerning for recurrent anemia. She denies melena, hematochezia or other overt form of blood loss.  She states she has been on oral iron in the past and did not tolerate this well.  She had a screening colonoscopy in February with removal of a tubular adenoma.  She She denies fevers or chills. She denies pain. Her appetite is good. Her weight has decreased 3 pounds over last year.  REVIEW OF SYSTEMS:  Review of Systems  Constitutional: Negative for appetite change, chills, fatigue, fever and unexpected weight change.  HENT:   Negative for lump/mass, mouth sores and sore throat.   Respiratory: Negative for cough and shortness of breath.   Cardiovascular: Negative for chest pain and leg swelling.  Gastrointestinal: Negative for abdominal pain, constipation, diarrhea, nausea and vomiting.  Endocrine: Negative for hot flashes.  Genitourinary: Negative for difficulty urinating, dysuria, frequency and hematuria.   Musculoskeletal:  Negative for arthralgias, back pain and myalgias.  Skin: Negative for rash.  Neurological: Negative for dizziness and headaches.  Hematological: Negative for adenopathy. Does not bruise/bleed easily.  Psychiatric/Behavioral: Negative for depression and sleep disturbance. The patient is not nervous/anxious.    VITALS:  Blood pressure (!) 143/90, pulse 78, temperature 99.1 F (37.3 C), temperature source Oral, resp. rate 18, height $RemoveBe'5\' 6"'KntrmTaja$  (1.676 m), weight 145 lb 8 oz (66 kg), SpO2 98 %.  Wt Readings from Last 3 Encounters:  04/20/21 142 lb 0.6 oz (64.4 kg)  04/20/21 145 lb 8 oz (66 kg)  03/22/21 146 lb 4 oz (66.3 kg)    Body mass index is 23.48 kg/m.  Performance status (ECOG): 0 - Asymptomatic  PHYSICAL EXAM:  Physical Exam Vitals and nursing note reviewed.  Constitutional:      General: She is not in acute distress.    Appearance: Normal appearance.  HENT:     Head: Normocephalic and atraumatic.     Mouth/Throat:     Mouth: Mucous membranes are moist.     Pharynx: Oropharynx is clear. No oropharyngeal exudate or posterior oropharyngeal erythema.  Eyes:     General: No scleral icterus.    Extraocular Movements: Extraocular movements intact.     Conjunctiva/sclera: Conjunctivae normal.     Pupils: Pupils are equal, round, and reactive to light.  Cardiovascular:     Rate and Rhythm: Normal rate and regular rhythm.     Heart sounds: Normal heart sounds. No murmur heard. No friction rub. No gallop.   Pulmonary:     Effort: Pulmonary effort is  normal.     Breath sounds: Normal breath sounds. No wheezing, rhonchi or rales.  Chest:  Breasts:     Right: No axillary adenopathy or supraclavicular adenopathy.     Left: No axillary adenopathy or supraclavicular adenopathy.    Abdominal:     General: There is no distension.     Palpations: Abdomen is soft. There is no hepatomegaly, splenomegaly or mass.     Tenderness: There is no abdominal tenderness.  Musculoskeletal:         General: Normal range of motion.     Cervical back: Normal range of motion and neck supple. No tenderness.     Right lower leg: No edema.     Left lower leg: No edema.  Lymphadenopathy:     Cervical: No cervical adenopathy.     Upper Body:     Right upper body: No supraclavicular or axillary adenopathy.     Left upper body: No supraclavicular or axillary adenopathy.     Lower Body: No right inguinal adenopathy. No left inguinal adenopathy.  Skin:    General: Skin is warm and dry.     Coloration: Skin is not jaundiced.     Findings: No rash.  Neurological:     Mental Status: She is alert and oriented to person, place, and time.     Cranial Nerves: No cranial nerve deficit.  Psychiatric:        Mood and Affect: Mood normal.        Behavior: Behavior normal.        Thought Content: Thought content normal.    LABS:   CBC Latest Ref Rng & Units 04/20/2021  WBC - 7.8  Hemoglobin 12.0 - 16.0 11.2(A)  Hematocrit 36 - 46 35(A)  Platelets 150 - 399 227   CMP Latest Ref Rng & Units 04/20/2021  BUN 4 - 21 12  Creatinine 0.5 - 1.1 0.6  Sodium 137 - 147 138  Potassium 3.4 - 5.3 3.7  Chloride 99 - 108 107  CO2 13 - 22 23(A)  Calcium 8.7 - 10.7 9.4  Alkaline Phos 25 - 125 106  AST 13 - 35 36(A)  ALT 7 - 35 14     MCV is 77  No results found for: CEA1 / No results found for: CEA1 No results found for: PSA1 No results found for: XBM841 No results found for: LKG401  No results found for: TOTALPROTELP, ALBUMINELP, A1GS, A2GS, BETS, BETA2SER, GAMS, MSPIKE, SPEI Lab Results  Component Value Date   TIBC 566 (H) 04/20/2021   FERRITIN 7 (L) 04/20/2021   IRONPCTSAT 5 (L) 04/20/2021   No results found for: LDH  STUDIES:  MM DIGITAL SCREENING BILATERAL  Result Date: 03/27/2021 CLINICAL DATA:  Screening. EXAM: DIGITAL SCREENING BILATERAL MAMMOGRAM WITH CAD TECHNIQUE: Bilateral screening digital craniocaudal and mediolateral oblique mammograms were obtained. The images were evaluated  with computer-aided detection. COMPARISON:  Previous exam(s). ACR Breast Density Category b: There are scattered areas of fibroglandular density. FINDINGS: There are no findings suspicious for malignancy. The images were evaluated with computer-aided detection. IMPRESSION: No mammographic evidence of malignancy. A result letter of this screening mammogram will be mailed directly to the patient. RECOMMENDATION: Screening mammogram in one year. (Code:SM-B-01Y) BI-RADS CATEGORY  1: Negative. Electronically Signed   By: Abelardo Diesel M.D.   On: 03/27/2021 14:51      HISTORY:   Past Medical History:  Diagnosis Date  . Pernicious anemia   . Pernicious anemia  History reviewed. No pertinent surgical history.  History reviewed. No pertinent family history.  Social History:  reports that she has never smoked. She has never used smokeless tobacco. She reports previous alcohol use. She reports that she does not use drugs.The patient is alone today.  Allergies:  Allergies  Allergen Reactions  . Codeine Nausea Only    UNKNOWN    Current Medications: Current Outpatient Medications  Medication Sig Dispense Refill  . Cholecalciferol 25 MCG (1000 UT) tablet Take by mouth.    Marland Kitchen aspirin 81 MG EC tablet Take by mouth.    Marland Kitchen aspirin-acetaminophen-caffeine (EXCEDRIN MIGRAINE) 250-250-65 MG tablet Take by mouth.    . cyanocobalamin (,VITAMIN B-12,) 1000 MCG/ML injection Inject into the muscle.    . loratadine (CLARITIN) 10 MG tablet Take by mouth.    . simvastatin (ZOCOR) 20 MG tablet Take 20 mg by mouth at bedtime.    . sodium chloride (OCEAN) 0.65 % SOLN nasal spray Place 1 spray into both nostrils as needed for congestion.    Marland Kitchen VITAMIN B1-B12 IM Inject 1,000 mcg into the muscle every 30 (thirty) days.    Merril Abbe 10 MCG TABS vaginal tablet Place vaginally.     No current facility-administered medications for this visit.     ASSESSMENT & PLAN:   Assessment:  1. Pernicious anemia, she will  continue B12 injections monthly. 2. New microcytic anemia, secondary to iron deficiency. Recent colonoscopy did not reveal any overt form of blood loss. Iron studies were added today. I will obtain stool Hemoccult x3  and plan to contact her gastroenterologist for further recommendation. I would recommend IV iron due to poor tolerance of oral iron in the past. The patient would like to discuss this with her primary care physician prior to scheduling this.   Plan:  Iron studies confirm iron deficiency. Stool Hemoccults are pending, I will contact her gastroenterologist regarding EGD. I do recommend IV iron replacement. The patient sees her primary care physician on Monday and would like to discuss with her prior to proceeding with any treatment for iron deficiency.  She will continue B12 monthly. The patient understands the plans discussed today and is in agreement with them.  She knows to contact our office when she has decided whether to proceed with IV iron.      Marvia Pickles, PA-C

## 2021-04-20 NOTE — Telephone Encounter (Signed)
Per 5/19 los next appt scheduled and given to patient 

## 2021-05-02 ENCOUNTER — Other Ambulatory Visit: Payer: Self-pay | Admitting: Hematology and Oncology

## 2021-05-02 ENCOUNTER — Inpatient Hospital Stay: Payer: Medicare Other

## 2021-05-02 ENCOUNTER — Other Ambulatory Visit: Payer: Self-pay

## 2021-05-02 DIAGNOSIS — D509 Iron deficiency anemia, unspecified: Secondary | ICD-10-CM

## 2021-05-02 LAB — FECAL OCCULT BLOOD, GUAIAC: Fecal Occult Blood: NEGATIVE

## 2021-05-03 ENCOUNTER — Encounter: Payer: Self-pay | Admitting: Hematology and Oncology

## 2021-05-03 ENCOUNTER — Telehealth: Payer: Self-pay

## 2021-05-03 NOTE — Telephone Encounter (Signed)
Pt's spouse called to request the results of the hemoccult testing. I told him that 2 of the 3 cards have resulted & are negative. He also wanted to know how to go about getting the iron infusions scheduled, as he is sure Tondra wants to do those. I explained to him that [facility] would order the infusions, then they must be authorized. Once authorized, a scheduler would call them to set up appt's. He states that Gerardo is out of town,and will be back on Friday. He will have her to call me then.

## 2021-05-05 ENCOUNTER — Encounter: Payer: Self-pay | Admitting: Oncology

## 2021-05-05 ENCOUNTER — Other Ambulatory Visit: Payer: Self-pay | Admitting: Pharmacist

## 2021-05-05 NOTE — Telephone Encounter (Addendum)
Pt notified that all 3 hemoccult results were negative.  ----- Message from Hilliard Clark sent at 05/04/2021  1:15 PM EDT ----- Regarding: FW: Hemoccult results All three are NEG    ----- Message ----- From: Adah Perl, PA-C Sent: 05/04/2021  11:47 AM EDT To: Hilliard Clark Subject: FW: Hemoccult results                          I only see one hemoccult reported, can you tell if all 3 were resulted? Thanks ----- Message ----- From: Hipolito Bayley, RN Sent: 05/03/2021   4:13 PM EDT To: Adah Perl, PA-C Subject: Hemoccult results                              Pt's spouse called to request the results of the hemoccult testing. I told him that 2 of the 3 cards have resulted & are negative.

## 2021-05-08 ENCOUNTER — Telehealth: Payer: Self-pay | Admitting: Hematology and Oncology

## 2021-05-08 NOTE — Telephone Encounter (Signed)
Per 6/3 Staff Msg, patient scheduled for 6/9, 6/16 Feraheme Infusions.  Patient notified

## 2021-05-11 ENCOUNTER — Other Ambulatory Visit: Payer: Self-pay

## 2021-05-11 ENCOUNTER — Inpatient Hospital Stay: Payer: Medicare Other | Attending: Oncology

## 2021-05-11 VITALS — BP 136/76 | HR 71 | Temp 97.8°F | Resp 18 | Ht 66.0 in | Wt 146.8 lb

## 2021-05-11 DIAGNOSIS — D509 Iron deficiency anemia, unspecified: Secondary | ICD-10-CM | POA: Insufficient documentation

## 2021-05-11 DIAGNOSIS — Z79899 Other long term (current) drug therapy: Secondary | ICD-10-CM | POA: Diagnosis not present

## 2021-05-11 DIAGNOSIS — E538 Deficiency of other specified B group vitamins: Secondary | ICD-10-CM | POA: Insufficient documentation

## 2021-05-11 MED ORDER — SODIUM CHLORIDE 0.9 % IV SOLN
510.0000 mg | Freq: Once | INTRAVENOUS | Status: AC
  Administered 2021-05-11: 510 mg via INTRAVENOUS
  Filled 2021-05-11: qty 17

## 2021-05-11 MED ORDER — SODIUM CHLORIDE 0.9 % IV SOLN
Freq: Once | INTRAVENOUS | Status: AC
  Filled 2021-05-11: qty 250

## 2021-05-11 NOTE — Progress Notes (Signed)
1617: PT STABLE AT TIME OF DISCHARGE

## 2021-05-18 ENCOUNTER — Other Ambulatory Visit: Payer: Self-pay

## 2021-05-18 ENCOUNTER — Inpatient Hospital Stay: Payer: Medicare Other

## 2021-05-18 VITALS — BP 137/81 | HR 76 | Temp 98.5°F | Resp 16 | Wt 145.0 lb

## 2021-05-18 DIAGNOSIS — E538 Deficiency of other specified B group vitamins: Secondary | ICD-10-CM | POA: Diagnosis not present

## 2021-05-18 MED ORDER — CYANOCOBALAMIN 1000 MCG/ML IJ SOLN
INTRAMUSCULAR | Status: AC
  Filled 2021-05-18: qty 1

## 2021-05-18 MED ORDER — SODIUM CHLORIDE 0.9 % IV SOLN
Freq: Once | INTRAVENOUS | Status: AC
  Filled 2021-05-18: qty 250

## 2021-05-18 MED ORDER — CYANOCOBALAMIN 1000 MCG/ML IJ SOLN
1000.0000 ug | Freq: Once | INTRAMUSCULAR | Status: AC
Start: 2021-05-18 — End: 2021-05-18
  Administered 2021-05-18: 1000 ug via INTRAMUSCULAR

## 2021-05-18 MED ORDER — SODIUM CHLORIDE 0.9 % IV SOLN
510.0000 mg | Freq: Once | INTRAVENOUS | Status: AC
  Administered 2021-05-18: 510 mg via INTRAVENOUS
  Filled 2021-05-18: qty 510

## 2021-05-18 NOTE — Progress Notes (Signed)
1553:PT STABLE AT TIME OF DISCHARGE °

## 2021-05-18 NOTE — Patient Instructions (Signed)
Cyanocobalamin, Vitamin B12 injection O que  este medicamento? A CIANOCOBALAMINA  uma forma sinttica de vitamina B12. A vitamina B12  essencial para o desenvolvimento de glbulos sanguneos, clulas nervosas e protenas saudveis pelo organismo. Tambm ajuda no metabolismo das gorduras e carboidratos. Este medicamento  usado para tratar pessoas que no conseguem absorver vitamina B12 suficiente. Este medicamento pode ser usado para outros propsitos; em caso de dvidas, pergunte ao seu profissional de sade ou farmacutico. NOMES DE MARCAS COMUNS: B-12 Compliance Kit, B-12 Injection Kit, Cyomin, LA-12, Nutri-Twelve, Physicians EZ Use B-12, Primabalt O que devo dizer a meu profissional de sade antes de tomar este medicamento? Precisam saber se voc tem algum dos seguintes problemas ou estados de sade: doenas renais sndrome ou doena de Leber anemia megaloblstica reao estranha ou alergia  cianocobalamina ou ao cobalto reao estranha ou alergia a outros medicamentos, alimentos, corantes ou conservantes est grvida ou tentando engravidar est amamentando Como devo usar este medicamento? Este medicamento  injetado por via intramuscular ou por injeo subcutnea profunda. Costuma ser administrado por um profissional de sade em consultrio ou Chief of Staff. Porm,  possvel que seu mdico lhe ensine como aplicar suas prprias injees. Siga todas as instrues. Fale com seu pediatra a respeito do uso deste medicamento em crianas. Pode ser preciso tomar alguns cuidados especiais. Superdosagem: Se achar que tomou uma superdosagem deste medicamento, entre em contato imediatamente com o [facility] ou v a [employer]. OBSERVAO: Este medicamento  s para voc. No compartilhe este medicamento com outras pessoas. E se eu deixar de tomar uma dose? Se toma o medicamento em uma clnica ou no consultrio do seu mdico, ligue para Paramedic a Passenger transport manager. Se aplica as  injees por conta prpria e perdeu uma dose, tome-a assim que possvel. Se j estiver quase na hora da sua prxima dose, tome somente essa dose. No tome o remdio em dobro, nem tome uma dose adicional. O que pode interagir com este medicamento? colchicina consumo pesado de lcool Esta lista pode no descrever todas as interaes possveis. D ao seu profissional de sade uma lista de todos os medicamentos, ervas medicinais, remdios de venda livre, ou suplementos alimentares que voc [place]. Diga tambm se voc fuma, bebe, ou [place] drogas ilcitas. Alguns destes podem interagir com o seu medicamento. Ao que devo ficar atento quando estiver [employer] medicamento? Consulte seu mdico ou profissional de sade para acompanhamento regular Museum/gallery curator. Voc precisar fazer exames de sangue peridicos enquanto estiver [employer]. Voc pode precisar seguir uma dieta especial. Fale com seu mdico. Para conseguir o mximo benefcio deste medicamento, limite o seu consumo de lcool e evite fumar. Que efeitos colaterais posso sentir aps usar este medicamento? Efeitos colaterais que devem ser informados ao seu mdico ou profissional de sade o mais rpido possvel: reaes alrgicas, como erupo na pele, coceira, urticria, ou inchao do rosto, dos lbios ou da lngua pele azulada dor ou aperto no peito chiado no peito ou dificuldade para respirar tontura rea vermelha, inchada e dolorosa na perna Efeitos colaterais que normalmente no precisam de cuidados mdicos (avise ao seu mdico ou profissional de sade se persistirem ou forem incmodos): diarreia dor de cabea Esta lista pode no descrever todos os efeitos colaterais possveis. Para mais orientaes sobre efeitos colaterais, consulte o seu mdico. Voc pode relatar a ocorrncia de efeitos colaterais  FDA pelo telefone [phone]. Onde devo guardar meu medicamento? [facility] fora do [employer]. Conservar em [employer], entre 15 e 30 degreesC (  59 e 86 degreesF). Proteger Administrator, arts. Descartar qualquer medicamento no utilizado aps a data de validade impressa no rtulo ou embalagem. OBSERVAO: Este folheto  um resumo. Pode no cobrir todas as informaes possveis. Se tiver dvidas a respeito deste medicamento, fale com seu mdico, farmacutico ou profissional de sade.  2021 Elsevier/Gold Standard (2010-08-23 00:00:00) Ferumoxytol injection What is this medication? FERUMOXYTOL is an iron complex. Iron is used to make healthy red blood cells, which carry oxygen and nutrients throughout the body. This medicine is used totreat iron deficiency anemia. This medicine may be used for other purposes; ask your health care provider orpharmacist if you have questions. COMMON BRAND NAME(S): Feraheme What should I tell my care team before I take this medication? They need to know if you have any of these conditions: anemia not caused by low iron levels high levels of iron in the blood magnetic resonance imaging (MRI) test scheduled an unusual or allergic reaction to iron, other medicines, foods, dyes, or preservatives pregnant or trying to get pregnant breast-feeding How should I use this medication? This medicine is for injection into a vein. It is given by a health careprofessional in a hospital or clinic setting. Talk to your pediatrician regarding the use of this medicine in children.Special care may be needed. Overdosage: If you think you have taken too much of this medicine contact apoison control center or emergency room at once. NOTE: This medicine is only for you. Do not share this medicine with others. What if I miss a dose? It is important not to miss your dose. Call your doctor or health careprofessional if you are unable to keep an appointment. What may interact with this medication? This medicine may interact with the following medications: other iron products This list may not describe all  possible interactions. Give your health care provider a list of all the medicines, herbs, non-prescription drugs, or dietary supplements you use. Also tell them if you smoke, drink alcohol, or use illegaldrugs. Some items may interact with your medicine. What should I watch for while using this medication? Visit your doctor or healthcare professional regularly. Tell your doctor or healthcare professional if your symptoms do not start to get better or if theyget worse. You may need blood work done while you are taking this medicine. You may need to follow a special diet. Talk to your doctor. Foods that contain iron include: whole grains/cereals, dried fruits, beans, or peas, leafy greenvegetables, and organ meats (liver, kidney). What side effects may I notice from receiving this medication? Side effects that you should report to your doctor or health care professionalas soon as possible: allergic reactions like skin rash, itching or hives, swelling of the face, lips, or tongue breathing problems changes in blood pressure feeling faint or lightheaded, falls fever or chills flushing, sweating, or hot feelings swelling of the ankles or feet Side effects that usually do not require medical attention (report to yourdoctor or health care professional if they continue or are bothersome): diarrhea headache nausea, vomiting stomach pain This list may not describe all possible side effects. Call your doctor for medical advice about side effects. You may report side effects to FDA at1-800-FDA-1088. Where should I keep my medication? This drug is given in a hospital or clinic and will not be stored at home. NOTE: This sheet is a summary. It may not cover all possible information. If you have questions about this medicine, talk to your doctor, pharmacist, orhealth care provider.  2022 Elsevier/Gold Standard (2017-01-07  20:21:10)

## 2021-05-19 ENCOUNTER — Other Ambulatory Visit: Payer: Self-pay | Admitting: Pharmacist

## 2021-05-22 ENCOUNTER — Inpatient Hospital Stay: Payer: Medicare Other

## 2021-06-09 ENCOUNTER — Encounter: Payer: Self-pay | Admitting: Oncology

## 2021-06-13 NOTE — Progress Notes (Signed)
[facility]  [address] [place],  [place]  [address] [phone]  Clinic Day:  06/15/2021  Referring physician: Rosalia Hammers, MD   CHIEF COMPLAINT:  CC:   Pernicious anemia  Current Treatment:   Monthly B12 injections   HISTORY OF PRESENT ILLNESS:  Ann Curtis is a 75 y.o. female with a history of pernicious anemia diagnosed in April 2008.  This was so severe, it was almost mistaken for leukemia.  She had pancytopenia and was found to have an extremely low B12 level. Her pancytopenia resolved with B12 repletion.  She continues monthly B12 injections.  As she has a history of ovarian cancer, as well as a family history of ovarian and breast cancer, she underwent genetic testing for BRCA 1 and 2, at another institution in 2017, which did not reveal any clinically significant mutation.  When she was seen in May for routine follow-up, she had mild microcytic anemia and was found to be iron deficient.  She did not have any obvious source of blood loss.  Stool hemoccults x3 were negative. Urinalysis did not reveal any occult blood.  She had a colonoscopy in February with removal of a tubular adenoma.  I suggestions she see Dr. Comer Curtis, her gastroenterologist, for consideration of EGD.  I recommended she receive IV iron replacement in the form of Feraheme, but she wanted to discuss this with her primary care provider, Dr. Newt Curtis. She proceeded with IV Feraheme in June.  She has continued monthly B12 injections.  INTERVAL HISTORY:  Ann Curtis is here today for repeat clinical assessment. She continues B12 monthly.  She denies progressive fatigue concerning for recurrent anemia. She denies melena, hematochezia or other overt form of blood loss. She denies heartburn, nausea, vomiting, diarrhea, constipation or abdominal pain. She has not seen Dr. Comer Curtis.  She states she will see Dr. Newt Curtis again soon and discuss this with her again. She denies fevers or  chills. She denies pain. Her appetite is good. Her weight has been stable.  REVIEW OF SYSTEMS:  Review of Systems  Constitutional:  Negative for appetite change, chills, fatigue, fever and unexpected weight change.  HENT:   Negative for lump/mass, mouth sores and sore throat.   Respiratory:  Negative for cough and shortness of breath.   Cardiovascular:  Negative for chest pain and leg swelling.  Gastrointestinal:  Negative for abdominal pain, constipation, diarrhea, nausea and vomiting.  Endocrine: Negative for hot flashes.  Genitourinary:  Negative for difficulty urinating, dysuria, frequency and hematuria.   Musculoskeletal:  Negative for arthralgias, back pain and myalgias.  Skin:  Negative for rash.  Neurological:  Negative for dizziness and headaches.  Hematological:  Negative for adenopathy. Does not bruise/bleed easily.  Psychiatric/Behavioral:  Negative for depression and sleep disturbance. The patient is not nervous/anxious.    VITALS:  Blood pressure (!) 157/84, pulse 88, temperature 98.4 F (36.9 C), temperature source Oral, resp. rate 18, height _0  (1.676 m), weight 145 lb 12.8 oz (66.1 kg), SpO2 97 %.  Wt Readings from Last 3 Encounters:  06/15/21 145 lb 12.8 oz (66.1 kg)  05/18/21 145 lb (65.8 kg)  05/11/21 146 lb 12 oz (66.6 kg)    Body mass index is 23.53 kg/m.  Performance status (ECOG): 0 - Asymptomatic  PHYSICAL EXAM:  Physical Exam Vitals and nursing note reviewed.  Constitutional:      General: She is not in acute distress.    Appearance: Normal appearance.  HENT:  Head: Normocephalic and atraumatic.     Mouth/Throat:     Mouth: Mucous membranes are moist.     Pharynx: Oropharynx is clear. No oropharyngeal exudate or posterior oropharyngeal erythema.  Eyes:     General: No scleral icterus.    Extraocular Movements: Extraocular movements intact.     Conjunctiva/sclera: Conjunctivae normal.     Pupils: Pupils are equal, round, and reactive to  light.  Cardiovascular:     Rate and Rhythm: Normal rate and regular rhythm.     Heart sounds: Normal heart sounds. No murmur heard.   No friction rub. No gallop.  Pulmonary:     Effort: Pulmonary effort is normal.     Breath sounds: Normal breath sounds. No wheezing, rhonchi or rales.  Chest:  Breasts:    Right: No axillary adenopathy or supraclavicular adenopathy.     Left: No axillary adenopathy or supraclavicular adenopathy.  Abdominal:     General: There is no distension.     Palpations: Abdomen is soft. There is no hepatomegaly, splenomegaly or mass.     Tenderness: There is no abdominal tenderness.  Musculoskeletal:        General: Normal range of motion.     Cervical back: Normal range of motion and neck supple. No tenderness.     Right lower leg: No edema.     Left lower leg: No edema.  Lymphadenopathy:     Cervical: No cervical adenopathy.     Upper Body:     Right upper body: No supraclavicular or axillary adenopathy.     Left upper body: No supraclavicular or axillary adenopathy.     Lower Body: No right inguinal adenopathy. No left inguinal adenopathy.  Skin:    General: Skin is warm and dry.     Coloration: Skin is not jaundiced.     Findings: No rash.  Neurological:     Mental Status: She is alert and oriented to person, place, and time.     Cranial Nerves: No cranial nerve deficit.  Psychiatric:        Mood and Affect: Mood normal.        Behavior: Behavior normal.        Thought Content: Thought content normal.   LABS:   CBC Latest Ref Rng & Units 06/15/2021 04/20/2021  WBC - 7.6 7.8  Hemoglobin 12.0 - 16.0 13.8 11.2(A)  Hematocrit 36 - 46 42 35(A)  Platelets 150 - 399 199 227   CMP Latest Ref Rng & Units 06/15/2021 04/20/2021  BUN 4 - _0 Creatinine 0.5 - 1.1 0.7 0.6  Sodium 137 - 147 141 138  Potassium 3.4 - 5.3 4.0 3.7  Chloride 99 - 108 103 107  CO2 13 - 22 27(A) 23(A)  Calcium 8.7 - 10.7 10.0 9.4  Alkaline Phos 25 - 125 105 106  AST 13 -  35 39(A) 36(A)  ALT 7 - 35 19 14     MCV is 77  No results found for: CEA1 / No results found for: CEA1 No results found for: PSA1 No results found for: KYH062 No results found for: CAN125  No results found for: TOTALPROTELP, ALBUMINELP, A1GS, A2GS, BETS, BETA2SER, GAMS, MSPIKE, SPEI Lab Results  Component Value Date   TIBC 566 (H) 04/20/2021   FERRITIN 7 (L) 04/20/2021   IRONPCTSAT 5 (L) 04/20/2021   No results found for: LDH  STUDIES:  No results found.     HISTORY:   Past Medical History:  Diagnosis Date   Pernicious anemia    Pernicious anemia     No past surgical history on file.  No family history on file.  Social History:  reports that she has never smoked. She has never used smokeless tobacco. She reports previous alcohol use. She reports that she does not use drugs.The patient is alone today.  Allergies:  Allergies  Allergen Reactions   Codeine Nausea Only    UNKNOWN    Current Medications: Current Outpatient Medications  Medication Sig Dispense Refill   aspirin 81 MG EC tablet Take by mouth.     aspirin-acetaminophen-caffeine (EXCEDRIN MIGRAINE) 250-250-65 MG tablet Take by mouth.     Cholecalciferol 25 MCG (1000 UT) tablet Take by mouth.     cyanocobalamin (,VITAMIN B-12,) 1000 MCG/ML injection Inject into the muscle.     loratadine (CLARITIN) 10 MG tablet Take by mouth.     simvastatin (ZOCOR) 20 MG tablet Take 20 mg by mouth at bedtime.     sodium chloride (OCEAN) 0.65 % SOLN nasal spray Place 1 spray into both nostrils as needed for congestion.     VITAMIN B1-B12 IM Inject 1,000 mcg into the muscle every 30 (thirty) days.     YUVAFEM 10 MCG TABS vaginal tablet Place vaginally.     No current facility-administered medications for this visit.     ASSESSMENT & PLAN:   Assessment:   1. Pernicious anemia, she will continue B12 injections monthly.  2.  Iron deficiency anemia diagnosed in May.  This has resolved with IV iron replacement.  3.   Mild elevation of the AST, likely medication effect of simvastatin.   Plan:  As she sees Dr. Newt Curtis regularly with labs, we will plan to see her back in 1 year with a CBC and comprehensive metabolic panel. If she has recurrent anemia, we will be glad to see her sooner. She will continue B12 monthly. The patient understands the plans discussed today and is in agreement with them. She knows to contact our office if there is any decrease in her hemoglobin or she has other concerns prior to her next appointment.     Marvia Pickles, PA-C

## 2021-06-15 ENCOUNTER — Telehealth: Payer: Self-pay | Admitting: Hematology and Oncology

## 2021-06-15 ENCOUNTER — Encounter: Payer: Self-pay | Admitting: Hematology and Oncology

## 2021-06-15 ENCOUNTER — Other Ambulatory Visit: Payer: Self-pay | Admitting: Hematology and Oncology

## 2021-06-15 ENCOUNTER — Other Ambulatory Visit: Payer: Self-pay

## 2021-06-15 ENCOUNTER — Inpatient Hospital Stay: Payer: Medicare Other

## 2021-06-15 ENCOUNTER — Inpatient Hospital Stay: Payer: Medicare Other | Attending: Oncology | Admitting: Hematology and Oncology

## 2021-06-15 VITALS — BP 157/84 | HR 88 | Temp 98.4°F | Resp 18 | Ht 66.0 in | Wt 145.8 lb

## 2021-06-15 DIAGNOSIS — Z8543 Personal history of malignant neoplasm of ovary: Secondary | ICD-10-CM | POA: Diagnosis not present

## 2021-06-15 DIAGNOSIS — Z8041 Family history of malignant neoplasm of ovary: Secondary | ICD-10-CM | POA: Diagnosis not present

## 2021-06-15 DIAGNOSIS — D509 Iron deficiency anemia, unspecified: Secondary | ICD-10-CM | POA: Insufficient documentation

## 2021-06-15 DIAGNOSIS — Z803 Family history of malignant neoplasm of breast: Secondary | ICD-10-CM | POA: Insufficient documentation

## 2021-06-15 DIAGNOSIS — E538 Deficiency of other specified B group vitamins: Secondary | ICD-10-CM | POA: Diagnosis not present

## 2021-06-15 DIAGNOSIS — Z79899 Other long term (current) drug therapy: Secondary | ICD-10-CM | POA: Insufficient documentation

## 2021-06-15 DIAGNOSIS — R7401 Elevation of levels of liver transaminase levels: Secondary | ICD-10-CM | POA: Diagnosis not present

## 2021-06-15 LAB — BASIC METABOLIC PANEL
BUN: 14 (ref 4–21)
BUN: 14 (ref 4–21)
CO2: 27 — AB (ref 13–22)
CO2: 27 — AB (ref 13–22)
Chloride: 103 (ref 99–108)
Chloride: 103 (ref 99–108)
Creatinine: 0.7 (ref 0.5–1.1)
Creatinine: 0.7 (ref 0.5–1.1)
Glucose: 97
Glucose: 97
Potassium: 4 (ref 3.4–5.3)
Potassium: 4 (ref 3.4–5.3)
Sodium: 141 (ref 137–147)
Sodium: 141 (ref 137–147)

## 2021-06-15 LAB — COMPREHENSIVE METABOLIC PANEL
Albumin: 4.8 (ref 3.5–5.0)
Albumin: 4.8 (ref 3.5–5.0)
Calcium: 10 (ref 8.7–10.7)
Calcium: 10 (ref 8.7–10.7)

## 2021-06-15 LAB — HEPATIC FUNCTION PANEL
ALT: 19 (ref 7–35)
ALT: 19 (ref 7–35)
AST: 39 — AB (ref 13–35)
AST: 39 — AB (ref 13–35)
Alkaline Phosphatase: 105 (ref 25–125)
Alkaline Phosphatase: 105 (ref 25–125)
Bilirubin, Total: 1
Bilirubin, Total: 1

## 2021-06-15 LAB — CBC AND DIFFERENTIAL
HCT: 42 (ref 36–46)
HCT: 42 (ref 36–46)
Hemoglobin: 13.8 (ref 12.0–16.0)
Hemoglobin: 13.8 (ref 12.0–16.0)
Neutrophils Absolute: 5.17
Neutrophils Absolute: 5.17
Platelets: 199 (ref 150–399)
Platelets: 199 (ref 150–399)
WBC: 7.6
WBC: 7.6

## 2021-06-15 LAB — IRON AND TIBC
Iron: 177 ug/dL — ABNORMAL HIGH (ref 28–170)
Saturation Ratios: 41 % — ABNORMAL HIGH (ref 10.4–31.8)
TIBC: 432 ug/dL (ref 250–450)
UIBC: 255 ug/dL

## 2021-06-15 LAB — CBC
MCV: 83 (ref 81–99)
RBC: 5.07 (ref 3.87–5.11)
RBC: 5.07 (ref 3.87–5.11)

## 2021-06-15 LAB — FERRITIN: Ferritin: 356 ng/mL — ABNORMAL HIGH (ref 11–307)

## 2021-06-15 NOTE — Telephone Encounter (Signed)
Per 7/14 los next appt scheduled and confirmed by patient 

## 2021-06-21 ENCOUNTER — Other Ambulatory Visit: Payer: Self-pay

## 2021-06-21 ENCOUNTER — Inpatient Hospital Stay: Payer: Medicare Other

## 2021-06-21 VITALS — BP 146/99 | HR 71 | Temp 98.2°F | Resp 18 | Ht 66.0 in | Wt 143.1 lb

## 2021-06-21 DIAGNOSIS — E538 Deficiency of other specified B group vitamins: Secondary | ICD-10-CM | POA: Diagnosis not present

## 2021-06-21 MED ORDER — CYANOCOBALAMIN 1000 MCG/ML IJ SOLN
1000.0000 ug | Freq: Once | INTRAMUSCULAR | Status: AC
  Administered 2021-06-21: 1000 ug via INTRAMUSCULAR

## 2021-06-21 MED ORDER — CYANOCOBALAMIN 1000 MCG/ML IJ SOLN
INTRAMUSCULAR | Status: AC
  Filled 2021-06-21: qty 1

## 2021-06-21 NOTE — Patient Instructions (Signed)
Vitamin B12 Deficiency Vitamin B12 deficiency means that your body does not have enough vitamin B12. The body needs this vitamin: To make red blood cells. To make genes (DNA). To help the nerves work. If you do not have enough vitamin B12 in your body, you can have health problems. What are the causes? Not eating enough foods that contain vitamin B12. Not being able to absorb vitamin B12 from the food that you eat. Certain digestive system diseases. A condition in which the body does not make enough of a certain protein, which results in too few red blood cells (pernicious anemia). Having a surgery in which part of the stomach or small intestine is removed. Taking medicines that make it hard for the body to absorb vitamin B12. These medicines include: Heartburn medicines. Some antibiotic medicines. Other medicines that are used to treat certain conditions. What increases the risk? Being older than age 50. Eating a vegetarian or vegan diet, especially while you are pregnant. Eating a poor diet while you are pregnant. Taking certain medicines. Having alcoholism. What are the signs or symptoms? In some cases, there are no symptoms. If the condition leads to too few blood cells or nerve damage, symptoms can occur, such as: Feeling weak. Feeling tired (fatigued). Not being hungry. Weight loss. A loss of feeling (numbness) or tingling in your hands and feet. Redness and burning of the tongue. Being mixed up (confused) or having memory problems. Sadness (depression). Problems with your senses. This can include color blindness, ringing in the ears, or loss of taste. Watery poop (diarrhea) or trouble pooping (constipation). Trouble walking. If anemia is very bad, symptoms can include: Being short of breath. Being dizzy. Having a very fast heartbeat. How is this treated? Changing the way you eat and drink, such as: Eating more foods that contain vitamin B12. Drinking little or no  alcohol. Getting vitamin B12 shots. Taking vitamin B12 supplements. Your doctor will tell you the dose that is best for you. Follow these instructions at home: Eating and drinking  Eat lots of healthy foods that contain vitamin B12. These include: Meats and poultry, such as beef, pork, chicken, turkey, and organ meats, such as liver. Seafood, such as clams, rainbow trout, salmon, tuna, and haddock. Eggs. Cereal and dairy products that have vitamin B12 added to them. Check the label. The items listed above may not be a complete list of what you can eat and drink. Contact a dietitian for more options. General instructions Get any shots as told by your doctor. Take supplements only as told by your doctor. Do not drink alcohol if your doctor tells you not to. In some cases, you may only be asked to limit alcohol use. Keep all follow-up visits as told by your doctor. This is important. Contact a doctor if: Your symptoms come back. Get help right away if: You have trouble breathing. You have a very fast heartbeat. You have chest pain. You get dizzy. You pass out. Summary Vitamin B12 deficiency means that your body is not getting enough vitamin B12. In some cases, there are no symptoms of this condition. Treatment may include making a change in the way you eat and drink, getting vitamin B12 shots, or taking supplements. Eat lots of healthy foods that contain vitamin B12. This information is not intended to replace advice given to you by your health care provider. Make sure you discuss any questions you have with your health care provider. Document Revised: 07/29/2018 Document Reviewed: 07/29/2018 Elsevier Patient Education    2022 Elsevier Inc.  

## 2021-07-18 ENCOUNTER — Other Ambulatory Visit: Payer: Self-pay | Admitting: Pharmacist

## 2021-07-24 ENCOUNTER — Other Ambulatory Visit: Payer: Self-pay

## 2021-07-24 ENCOUNTER — Inpatient Hospital Stay: Payer: Medicare Other | Attending: Oncology

## 2021-07-24 VITALS — BP 159/85 | HR 78 | Temp 98.7°F | Resp 18 | Ht 66.0 in | Wt 145.8 lb

## 2021-07-24 DIAGNOSIS — E538 Deficiency of other specified B group vitamins: Secondary | ICD-10-CM | POA: Diagnosis present

## 2021-07-24 DIAGNOSIS — Z79899 Other long term (current) drug therapy: Secondary | ICD-10-CM | POA: Diagnosis not present

## 2021-07-24 MED ORDER — CYANOCOBALAMIN 1000 MCG/ML IJ SOLN
1000.0000 ug | Freq: Once | INTRAMUSCULAR | Status: AC
  Administered 2021-07-24: 1000 ug via INTRAMUSCULAR
  Filled 2021-07-24: qty 1

## 2021-07-24 NOTE — Patient Instructions (Signed)
Vitamin B12 Injection What is this medication? Vitamin B12 (VAHY tuh min B12) prevents and treats low vitamin B12 levels in your body. It is used in people who do not get enough vitamin B12 from their diet or when their digestive tract does not absorb enough. Vitamin B12 plays an important role in maintaining the health of your nervous system and red bloodcells. This medicine may be used for other purposes; ask your health care provider orpharmacist if you have questions. COMMON BRAND NAME(S): B-12 Compliance Kit, B-12 Injection Kit, Cyomin, LA-12,Nutri-Twelve, Physicians EZ Use B-12, Primabalt What should I tell my care team before I take this medication? They need to know if you have any of these conditions: Kidney disease Leber's disease Megaloblastic anemia An unusual or allergic reaction to cyanocobalamin, cobalt, other medications, foods, dyes, or preservatives Pregnant or trying to get pregnant Breast-feeding How should I use this medication? This medication is injected into a muscle or deeply under the skin. It is usually given in a clinic or care team's office. However, your care team mayteach you how to inject yourself. Follow all instructions. Talk to your care team about the use of this medication in children. Specialcare may be needed. Overdosage: If you think you have taken too much of this medicine contact apoison control center or emergency room at once. NOTE: This medicine is only for you. Do not share this medicine with others. What if I miss a dose? If you are given your dose at a clinic or care team's office, call to reschedule your appointment. If you give your own injections, and you miss a dose, take it as soon as you can. If it is almost time for your next dose, takeonly that dose. Do not take double or extra doses. What may interact with this medication? Colchicine Heavy alcohol intake This list may not describe all possible interactions. Give your health care provider  a list of all the medicines, herbs, non-prescription drugs, or dietary supplements you use. Also tell them if you smoke, drink alcohol, or use illegaldrugs. Some items may interact with your medicine. What should I watch for while using this medication? Visit your care team regularly. You may need blood work done while you aretaking this medication. You may need to follow a special diet. Talk to your care team. Limit youralcohol intake and avoid smoking to get the best benefit. What side effects may I notice from receiving this medication? Side effects that you should report to your care team as soon as possible: Allergic reactions-skin rash, itching, hives, swelling of the face, lips, tongue, or throat Swelling of the ankles, hands, or feet Trouble breathing Side effects that usually do not require medical attention (report to your careteam if they continue or are bothersome): Diarrhea This list may not describe all possible side effects. Call your doctor for medical advice about side effects. You may report side effects to FDA at1-800-FDA-1088. Where should I keep my medication? Keep out of the reach of children. Store at room temperature between 15 and 30 degrees C (59 and 85 degrees F).Protect from light. Throw away any unused medication after the expiration date. NOTE: This sheet is a summary. It may not cover all possible information. If you have questions about this medicine, talk to your doctor, pharmacist, orhealth care provider.  2022 Elsevier/Gold Standard (2021-01-09 11:47:06)  

## 2021-08-15 ENCOUNTER — Encounter: Payer: Self-pay | Admitting: Oncology

## 2021-08-22 ENCOUNTER — Other Ambulatory Visit: Payer: Self-pay

## 2021-08-22 ENCOUNTER — Inpatient Hospital Stay: Payer: Medicare Other | Attending: Oncology

## 2021-08-22 VITALS — BP 143/78 | HR 91 | Temp 98.5°F | Resp 18 | Ht 66.0 in | Wt 146.0 lb

## 2021-08-22 DIAGNOSIS — E538 Deficiency of other specified B group vitamins: Secondary | ICD-10-CM | POA: Insufficient documentation

## 2021-08-22 DIAGNOSIS — Z79899 Other long term (current) drug therapy: Secondary | ICD-10-CM | POA: Insufficient documentation

## 2021-08-22 MED ORDER — CYANOCOBALAMIN 1000 MCG/ML IJ SOLN
1000.0000 ug | Freq: Once | INTRAMUSCULAR | Status: AC
  Administered 2021-08-22: 1000 ug via INTRAMUSCULAR
  Filled 2021-08-22: qty 1

## 2021-08-22 NOTE — Patient Instructions (Signed)

## 2021-09-20 ENCOUNTER — Encounter: Payer: Self-pay | Admitting: Oncology

## 2021-09-21 ENCOUNTER — Other Ambulatory Visit: Payer: Self-pay

## 2021-09-21 ENCOUNTER — Inpatient Hospital Stay: Payer: Medicare Other | Attending: Oncology

## 2021-09-21 VITALS — BP 138/79 | HR 68 | Temp 98.0°F | Resp 18 | Ht 66.0 in | Wt 146.0 lb

## 2021-09-21 DIAGNOSIS — E538 Deficiency of other specified B group vitamins: Secondary | ICD-10-CM | POA: Insufficient documentation

## 2021-09-21 DIAGNOSIS — Z79899 Other long term (current) drug therapy: Secondary | ICD-10-CM | POA: Diagnosis not present

## 2021-09-21 MED ORDER — CYANOCOBALAMIN 1000 MCG/ML IJ SOLN
1000.0000 ug | Freq: Once | INTRAMUSCULAR | Status: AC
  Administered 2021-09-21: 1000 ug via INTRAMUSCULAR
  Filled 2021-09-21: qty 1

## 2021-09-21 NOTE — Patient Instructions (Signed)

## 2021-10-17 ENCOUNTER — Encounter: Payer: Self-pay | Admitting: Oncology

## 2021-10-23 ENCOUNTER — Other Ambulatory Visit: Payer: Self-pay

## 2021-10-23 ENCOUNTER — Inpatient Hospital Stay: Payer: Medicare Other | Attending: Oncology

## 2021-10-23 VITALS — BP 155/79 | HR 77 | Temp 97.9°F | Resp 18 | Ht 66.0 in | Wt 146.0 lb

## 2021-10-23 DIAGNOSIS — E538 Deficiency of other specified B group vitamins: Secondary | ICD-10-CM | POA: Insufficient documentation

## 2021-10-23 MED ORDER — CYANOCOBALAMIN 1000 MCG/ML IJ SOLN
1000.0000 ug | Freq: Once | INTRAMUSCULAR | Status: AC
  Administered 2021-10-23: 1000 ug via INTRAMUSCULAR
  Filled 2021-10-23: qty 1

## 2021-10-23 NOTE — Patient Instructions (Signed)
Vitamin B12 Deficiency Vitamin B12 deficiency means that your body does not have enough vitamin B12. The body needs this important vitamin: To make red blood cells. To make genes (DNA). To help the nerves work. If you do not have enough vitamin B12 in your body, you can have health problems, such as not having enough red blood cells in the blood (anemia). What are the causes? Not eating enough foods that contain vitamin B12. Not being able to take in (absorb) vitamin B12 from the food that you eat. Certain diseases. A condition in which the body does not make enough of a certain protein. This results in your body not taking in enough vitamin B12. Having a surgery in which part of the stomach or small intestine is taken out. Taking medicines that make it hard for the body to take in vitamin B12. These include: Heartburn medicines. Some medicines that are used to treat diabetes. What increases the risk? Being an older adult. Eating a vegetarian or vegan diet that does not include any foods that come from animals. Not eating enough foods that contain vitamin B12 while you are pregnant. Taking certain medicines. Having alcoholism. What are the signs or symptoms? In some cases, there are no symptoms. If the condition leads to too few blood cells or nerve damage, symptoms can occur, such as: Feeling weak or tired. Not being hungry. Losing feeling (numbness) or tingling in your hands and feet. Redness and burning of the tongue. Feeling sad (depressed). Confusion or memory problems. Trouble walking. If anemia is very bad, symptoms can include: Being short of breath. Being dizzy. Having a very fast heartbeat. How is this treated? Changing the way you eat and drink, such as: Eating more foods that contain vitamin B12. Drinking little or no alcohol. Getting vitamin B12 shots. Taking vitamin B12 supplements by mouth (orally). Your doctor will tell you the dose that is best for you. Follow  these instructions at home: Eating and drinking  Eat foods that come from animals and have a lot of vitamin B12 in them. These include: Meats and poultry. This includes beef, pork, chicken, turkey, and organ meats, such as liver. Seafood, such as clams, rainbow trout, salmon, tuna, and haddock. Eggs. Dairy foods such as milk, yogurt, and cheese. Eat breakfast cereals that have vitamin B12 added to them (are fortified). Check the label. The items listed above may not be a complete list of foods and beverages you can eat and drink. Contact a dietitian for more information. Alcohol use Do not drink alcohol if: Your doctor tells you not to drink. You are pregnant, may be pregnant, or are planning to become pregnant. If you drink alcohol: Limit how much you have to: 0-1 drink a day for women. 0-2 drinks a day for men. Know how much alcohol is in your drink. In the [place] one drink equals one 12 oz bottle of beer (355 mL), one 5 oz glass of wine (148 mL), or one 1 oz glass of hard liquor (44 mL). General instructions Get any vitamin B12 shots if told by your doctor. Take supplements only as told by your doctor. Follow the directions. Keep all follow-up visits. Contact a doctor if: Your symptoms come back. Your symptoms get worse or do not get better with treatment. Get help right away if: You have trouble breathing. You have a very fast heartbeat. You have chest pain. You get dizzy. You faint. These symptoms may be an emergency. Get help right away. Call 911.   Do not wait to see if the symptoms will go away. Do not drive yourself to the hospital. Summary Vitamin B12 deficiency means that your body is not getting enough of the vitamin. In some cases, there are no symptoms of this condition. Treatment may include making a change in the way you eat and drink, getting shots, or taking supplements. Eat foods that have vitamin B12 in them. This information is not intended to replace advice  given to you by your health care provider. Make sure you discuss any questions you have with your health care provider. Document Revised: 07/14/2021 Document Reviewed: 07/14/2021 Elsevier Patient Education  2022 Elsevier Inc.  

## 2021-11-16 ENCOUNTER — Other Ambulatory Visit: Payer: Self-pay | Admitting: Pharmacist

## 2021-11-21 ENCOUNTER — Other Ambulatory Visit: Payer: Self-pay

## 2021-11-21 ENCOUNTER — Inpatient Hospital Stay: Payer: Medicare Other | Attending: Oncology

## 2021-11-21 VITALS — BP 143/82 | HR 82 | Temp 98.1°F | Resp 18 | Wt 148.0 lb

## 2021-11-21 DIAGNOSIS — Z79899 Other long term (current) drug therapy: Secondary | ICD-10-CM | POA: Diagnosis not present

## 2021-11-21 DIAGNOSIS — E538 Deficiency of other specified B group vitamins: Secondary | ICD-10-CM | POA: Insufficient documentation

## 2021-11-21 MED ORDER — CYANOCOBALAMIN 1000 MCG/ML IJ SOLN
1000.0000 ug | Freq: Once | INTRAMUSCULAR | Status: AC
  Administered 2021-11-21: 1000 ug via INTRAMUSCULAR
  Filled 2021-11-21: qty 1

## 2021-11-21 NOTE — Patient Instructions (Signed)
Vitamin B12 Injection °What is this medication? °Vitamin B12 (VAHY tuh min B12) prevents and treats low vitamin B12 levels in your body. It is used in people who do not get enough vitamin B12 from their diet or when their digestive tract does not absorb enough. Vitamin B12 plays an important role in maintaining the health of your nervous system and red blood cells. °This medicine may be used for other purposes; ask your health care provider or pharmacist if you have questions. °COMMON BRAND NAME(S): B-12 Compliance Kit, B-12 Injection Kit, Cyomin, Dodex, LA-12, Nutri-Twelve, Physicians EZ Use B-12, Primabalt °What should I tell my care team before I take this medication? °They need to know if you have any of these conditions: °Kidney disease °Leber's disease °Megaloblastic anemia °An unusual or allergic reaction to cyanocobalamin, cobalt, other medications, foods, dyes, or preservatives °Pregnant or trying to get pregnant °Breast-feeding °How should I use this medication? °This medication is injected into a muscle or deeply under the skin. It is usually given in a clinic or care team's office. However, your care team may teach you how to inject yourself. Follow all instructions. °Talk to your care team about the use of this medication in children. Special care may be needed. °Overdosage: If you think you have taken too much of this medicine contact a poison control center or emergency room at once. °NOTE: This medicine is only for you. Do not share this medicine with others. °What if I miss a dose? °If you are given your dose at a clinic or care team's office, call to reschedule your appointment. If you give your own injections, and you miss a dose, take it as soon as you can. If it is almost time for your next dose, take only that dose. Do not take double or extra doses. °What may interact with this medication? °Colchicine °Heavy alcohol intake °This list may not describe all possible interactions. Give your health  care provider a list of all the medicines, herbs, non-prescription drugs, or dietary supplements you use. Also tell them if you smoke, drink alcohol, or use illegal drugs. Some items may interact with your medicine. °What should I watch for while using this medication? °Visit your care team regularly. You may need blood work done while you are taking this medication. °You may need to follow a special diet. Talk to your care team. Limit your alcohol intake and avoid smoking to get the best benefit. °What side effects may I notice from receiving this medication? °Side effects that you should report to your care team as soon as possible: °Allergic reactions--skin rash, itching, hives, swelling of the face, lips, tongue, or throat °Swelling of the ankles, hands, or feet °Trouble breathing °Side effects that usually do not require medical attention (report to your care team if they continue or are bothersome): °Diarrhea °This list may not describe all possible side effects. Call your doctor for medical advice about side effects. You may report side effects to FDA at 1-800-FDA-1088. °Where should I keep my medication? °Keep out of the reach of children. °Store at room temperature between 15 and 30 degrees C (59 and 85 degrees F). Protect from light. Throw away any unused medication after the expiration date. °NOTE: This sheet is a summary. It may not cover all possible information. If you have questions about this medicine, talk to your doctor, pharmacist, or health care provider. °© 2022 Elsevier/Gold Standard (2021-02-01 00:00:00) ° °

## 2021-12-19 ENCOUNTER — Other Ambulatory Visit: Payer: Self-pay

## 2021-12-19 ENCOUNTER — Inpatient Hospital Stay: Payer: Medicare Other | Attending: Oncology

## 2021-12-19 VITALS — BP 131/75 | HR 70 | Temp 98.3°F | Resp 18 | Ht 66.0 in | Wt 148.0 lb

## 2021-12-19 DIAGNOSIS — E538 Deficiency of other specified B group vitamins: Secondary | ICD-10-CM | POA: Diagnosis present

## 2021-12-19 MED ORDER — CYANOCOBALAMIN 1000 MCG/ML IJ SOLN
1000.0000 ug | Freq: Once | INTRAMUSCULAR | Status: AC
  Administered 2021-12-19: 1000 ug via INTRAMUSCULAR
  Filled 2021-12-19: qty 1

## 2021-12-19 NOTE — Progress Notes (Signed)
1500: PT STABLE AT TIME OF DISCHARGE  

## 2021-12-19 NOTE — Patient Instructions (Signed)
Vitamin B12 Injection °What is this medication? °Vitamin B12 (VAHY tuh min B12) prevents and treats low vitamin B12 levels in your body. It is used in people who do not get enough vitamin B12 from their diet or when their digestive tract does not absorb enough. Vitamin B12 plays an important role in maintaining the health of your nervous system and red blood cells. °This medicine may be used for other purposes; ask your health care provider or pharmacist if you have questions. °COMMON BRAND NAME(S): B-12 Compliance Kit, B-12 Injection Kit, Cyomin, Dodex, LA-12, Nutri-Twelve, Physicians EZ Use B-12, Primabalt °What should I tell my care team before I take this medication? °They need to know if you have any of these conditions: °Kidney disease °Leber's disease °Megaloblastic anemia °An unusual or allergic reaction to cyanocobalamin, cobalt, other medications, foods, dyes, or preservatives °Pregnant or trying to get pregnant °Breast-feeding °How should I use this medication? °This medication is injected into a muscle or deeply under the skin. It is usually given in a clinic or care team's office. However, your care team may teach you how to inject yourself. Follow all instructions. °Talk to your care team about the use of this medication in children. Special care may be needed. °Overdosage: If you think you have taken too much of this medicine contact a poison control center or emergency room at once. °NOTE: This medicine is only for you. Do not share this medicine with others. °What if I miss a dose? °If you are given your dose at a clinic or care team's office, call to reschedule your appointment. If you give your own injections, and you miss a dose, take it as soon as you can. If it is almost time for your next dose, take only that dose. Do not take double or extra doses. °What may interact with this medication? °Colchicine °Heavy alcohol intake °This list may not describe all possible interactions. Give your health  care provider a list of all the medicines, herbs, non-prescription drugs, or dietary supplements you use. Also tell them if you smoke, drink alcohol, or use illegal drugs. Some items may interact with your medicine. °What should I watch for while using this medication? °Visit your care team regularly. You may need blood work done while you are taking this medication. °You may need to follow a special diet. Talk to your care team. Limit your alcohol intake and avoid smoking to get the best benefit. °What side effects may I notice from receiving this medication? °Side effects that you should report to your care team as soon as possible: °Allergic reactions--skin rash, itching, hives, swelling of the face, lips, tongue, or throat °Swelling of the ankles, hands, or feet °Trouble breathing °Side effects that usually do not require medical attention (report to your care team if they continue or are bothersome): °Diarrhea °This list may not describe all possible side effects. Call your doctor for medical advice about side effects. You may report side effects to FDA at 1-800-FDA-1088. °Where should I keep my medication? °Keep out of the reach of children. °Store at room temperature between 15 and 30 degrees C (59 and 85 degrees F). Protect from light. Throw away any unused medication after the expiration date. °NOTE: This sheet is a summary. It may not cover all possible information. If you have questions about this medicine, talk to your doctor, pharmacist, or health care provider. °© 2022 Elsevier/Gold Standard (2021-02-01 00:00:00) ° °

## 2022-01-16 ENCOUNTER — Inpatient Hospital Stay: Payer: Medicare Other | Attending: Oncology

## 2022-01-16 ENCOUNTER — Other Ambulatory Visit: Payer: Self-pay

## 2022-01-16 VITALS — BP 147/58 | HR 72 | Temp 98.5°F | Resp 18 | Wt 149.2 lb

## 2022-01-16 DIAGNOSIS — E538 Deficiency of other specified B group vitamins: Secondary | ICD-10-CM | POA: Insufficient documentation

## 2022-01-16 DIAGNOSIS — Z79899 Other long term (current) drug therapy: Secondary | ICD-10-CM | POA: Diagnosis not present

## 2022-01-16 MED ORDER — CYANOCOBALAMIN 1000 MCG/ML IJ SOLN
1000.0000 ug | Freq: Once | INTRAMUSCULAR | Status: AC
  Administered 2022-01-16: 1000 ug via INTRAMUSCULAR
  Filled 2022-01-16: qty 1

## 2022-01-16 NOTE — Patient Instructions (Signed)
Vitamin B12 Deficiency Vitamin B12 deficiency means that your body does not have enough vitamin B12. The body needs this important vitamin: To make red blood cells. To make genes (DNA). To help the nerves work. If you do not have enough vitamin B12 in your body, you can have health problems, such as not having enough red blood cells in the blood (anemia). What are the causes? Not eating enough foods that contain vitamin B12. Not being able to take in (absorb) vitamin B12 from the food that you eat. Certain diseases. A condition in which the body does not make enough of a certain protein. This results in your body not taking in enough vitamin B12. Having a surgery in which part of the stomach or small intestine is taken out. Taking medicines that make it hard for the body to take in vitamin B12. These include: Heartburn medicines. Some medicines that are used to treat diabetes. What increases the risk? Being an older adult. Eating a vegetarian or vegan diet that does not include any foods that come from animals. Not eating enough foods that contain vitamin B12 while you are pregnant. Taking certain medicines. Having alcoholism. What are the signs or symptoms? In some cases, there are no symptoms. If the condition leads to too few blood cells or nerve damage, symptoms can occur, such as: Feeling weak or tired. Not being hungry. Losing feeling (numbness) or tingling in your hands and feet. Redness and burning of the tongue. Feeling sad (depressed). Confusion or memory problems. Trouble walking. If anemia is very bad, symptoms can include: Being short of breath. Being dizzy. Having a very fast heartbeat. How is this treated? Changing the way you eat and drink, such as: Eating more foods that contain vitamin B12. Drinking little or no alcohol. Getting vitamin B12 shots. Taking vitamin B12 supplements by mouth (orally). Your doctor will tell you the dose that is best for you. Follow  these instructions at home: Eating and drinking  Eat foods that come from animals and have a lot of vitamin B12 in them. These include: Meats and poultry. This includes beef, pork, chicken, turkey, and organ meats, such as liver. Seafood, such as clams, rainbow trout, salmon, tuna, and haddock. Eggs. Dairy foods such as milk, yogurt, and cheese. Eat breakfast cereals that have vitamin B12 added to them (are fortified). Check the label. The items listed above may not be a complete list of foods and beverages you can eat and drink. Contact a dietitian for more information. Alcohol use Do not drink alcohol if: Your doctor tells you not to drink. You are pregnant, may be pregnant, or are planning to become pregnant. If you drink alcohol: Limit how much you have to: 0-1 drink a day for women. 0-2 drinks a day for men. Know how much alcohol is in your drink. In the [place] one drink equals one 12 oz bottle of beer (355 mL), one 5 oz glass of wine (148 mL), or one 1 oz glass of hard liquor (44 mL). General instructions Get any vitamin B12 shots if told by your doctor. Take supplements only as told by your doctor. Follow the directions. Keep all follow-up visits. Contact a doctor if: Your symptoms come back. Your symptoms get worse or do not get better with treatment. Get help right away if: You have trouble breathing. You have a very fast heartbeat. You have chest pain. You get dizzy. You faint. These symptoms may be an emergency. Get help right away. Call 911.   Do not wait to see if the symptoms will go away. Do not drive yourself to the hospital. Summary Vitamin B12 deficiency means that your body is not getting enough of the vitamin. In some cases, there are no symptoms of this condition. Treatment may include making a change in the way you eat and drink, getting shots, or taking supplements. Eat foods that have vitamin B12 in them. This information is not intended to replace advice  given to you by your health care provider. Make sure you discuss any questions you have with your health care provider. Document Revised: 07/14/2021 Document Reviewed: 07/14/2021 Elsevier Patient Education  2022 Elsevier Inc.  

## 2022-02-13 ENCOUNTER — Other Ambulatory Visit: Payer: Self-pay

## 2022-02-13 ENCOUNTER — Inpatient Hospital Stay: Payer: Medicare Other | Attending: Oncology

## 2022-02-13 VITALS — BP 139/78 | HR 70 | Temp 99.1°F | Resp 16 | Ht 65.35 in | Wt 148.8 lb

## 2022-02-13 DIAGNOSIS — E538 Deficiency of other specified B group vitamins: Secondary | ICD-10-CM | POA: Diagnosis present

## 2022-02-13 DIAGNOSIS — Z79899 Other long term (current) drug therapy: Secondary | ICD-10-CM | POA: Diagnosis not present

## 2022-02-13 MED ORDER — CYANOCOBALAMIN 1000 MCG/ML IJ SOLN
1000.0000 ug | Freq: Once | INTRAMUSCULAR | Status: AC
  Administered 2022-02-13: 1000 ug via INTRAMUSCULAR
  Filled 2022-02-13: qty 1

## 2022-02-13 NOTE — Patient Instructions (Signed)
Vitamin B12 Injection ?What is this medication? ?Vitamin B12 (VAHY tuh min B12) prevents and treats low vitamin B12 levels in your body. It is used in people who do not get enough vitamin B12 from their diet or when their digestive tract does not absorb enough. Vitamin B12 plays an important role in maintaining the health of your nervous system and red blood cells. ?This medicine may be used for other purposes; ask your health care provider or pharmacist if you have questions. ?COMMON BRAND NAME(S): B-12 Compliance Kit, B-12 Injection Kit, Cyomin, Dodex, LA-12, Nutri-Twelve, Physicians EZ Use B-12, Primabalt ?What should I tell my care team before I take this medication? ?They need to know if you have any of these conditions: ?Kidney disease ?Leber's disease ?Megaloblastic anemia ?An unusual or allergic reaction to cyanocobalamin, cobalt, other medications, foods, dyes, or preservatives ?Pregnant or trying to get pregnant ?Breast-feeding ?How should I use this medication? ?This medication is injected into a muscle or deeply under the skin. It is usually given in a clinic or care team's office. However, your care team may teach you how to inject yourself. Follow all instructions. ?Talk to your care team about the use of this medication in children. Special care may be needed. ?Overdosage: If you think you have taken too much of this medicine contact a poison control center or emergency room at once. ?NOTE: This medicine is only for you. Do not share this medicine with others. ?What if I miss a dose? ?If you are given your dose at a clinic or care team's office, call to reschedule your appointment. If you give your own injections, and you miss a dose, take it as soon as you can. If it is almost time for your next dose, take only that dose. Do not take double or extra doses. ?What may interact with this medication? ?Colchicine ?Heavy alcohol intake ?This list may not describe all possible interactions. Give your health  care provider a list of all the medicines, herbs, non-prescription drugs, or dietary supplements you use. Also tell them if you smoke, drink alcohol, or use illegal drugs. Some items may interact with your medicine. ?What should I watch for while using this medication? ?Visit your care team regularly. You may need blood work done while you are taking this medication. ?You may need to follow a special diet. Talk to your care team. Limit your alcohol intake and avoid smoking to get the best benefit. ?What side effects may I notice from receiving this medication? ?Side effects that you should report to your care team as soon as possible: ?Allergic reactions--skin rash, itching, hives, swelling of the face, lips, tongue, or throat ?Swelling of the ankles, hands, or feet ?Trouble breathing ?Side effects that usually do not require medical attention (report to your care team if they continue or are bothersome): ?Diarrhea ?This list may not describe all possible side effects. Call your doctor for medical advice about side effects. You may report side effects to FDA at 1-800-FDA-1088. ?Where should I keep my medication? ?Keep out of the reach of children. ?Store at room temperature between 15 and 30 degrees C (59 and 85 degrees F). Protect from light. Throw away any unused medication after the expiration date. ?NOTE: This sheet is a summary. It may not cover all possible information. If you have questions about this medicine, talk to your doctor, pharmacist, or health care provider. ?? 2022 Elsevier/Gold Standard (2021-02-01 00:00:00) ? ?

## 2022-03-13 ENCOUNTER — Inpatient Hospital Stay: Payer: Medicare Other | Attending: Oncology

## 2022-03-13 VITALS — BP 149/80 | HR 69 | Temp 98.1°F | Resp 18 | Wt 149.4 lb

## 2022-03-13 DIAGNOSIS — Z79899 Other long term (current) drug therapy: Secondary | ICD-10-CM | POA: Insufficient documentation

## 2022-03-13 DIAGNOSIS — E538 Deficiency of other specified B group vitamins: Secondary | ICD-10-CM | POA: Insufficient documentation

## 2022-03-13 MED ORDER — CYANOCOBALAMIN 1000 MCG/ML IJ SOLN
1000.0000 ug | Freq: Once | INTRAMUSCULAR | Status: AC
  Administered 2022-03-13: 1000 ug via INTRAMUSCULAR
  Filled 2022-03-13: qty 1

## 2022-03-13 NOTE — Patient Instructions (Signed)
Vitamin B12 Injection ?What is this medication? ?Vitamin B12 (VAHY tuh min B12) prevents and treats low vitamin B12 levels in your body. It is used in people who do not get enough vitamin B12 from their diet or when their digestive tract does not absorb enough. Vitamin B12 plays an important role in maintaining the health of your nervous system and red blood cells. ?This medicine may be used for other purposes; ask your health care provider or pharmacist if you have questions. ?COMMON BRAND NAME(S): B-12 Compliance Kit, B-12 Injection Kit, Cyomin, Dodex, LA-12, Nutri-Twelve, Physicians EZ Use B-12, Primabalt ?What should I tell my care team before I take this medication? ?They need to know if you have any of these conditions: ?Kidney disease ?Leber's disease ?Megaloblastic anemia ?An unusual or allergic reaction to cyanocobalamin, cobalt, other medications, foods, dyes, or preservatives ?Pregnant or trying to get pregnant ?Breast-feeding ?How should I use this medication? ?This medication is injected into a muscle or deeply under the skin. It is usually given in a clinic or care team's office. However, your care team may teach you how to inject yourself. Follow all instructions. ?Talk to your care team about the use of this medication in children. Special care may be needed. ?Overdosage: If you think you have taken too much of this medicine contact a poison control center or emergency room at once. ?NOTE: This medicine is only for you. Do not share this medicine with others. ?What if I miss a dose? ?If you are given your dose at a clinic or care team's office, call to reschedule your appointment. If you give your own injections, and you miss a dose, take it as soon as you can. If it is almost time for your next dose, take only that dose. Do not take double or extra doses. ?What may interact with this medication? ?Colchicine ?Heavy alcohol intake ?This list may not describe all possible interactions. Give your health  care provider a list of all the medicines, herbs, non-prescription drugs, or dietary supplements you use. Also tell them if you smoke, drink alcohol, or use illegal drugs. Some items may interact with your medicine. ?What should I watch for while using this medication? ?Visit your care team regularly. You may need blood work done while you are taking this medication. ?You may need to follow a special diet. Talk to your care team. Limit your alcohol intake and avoid smoking to get the best benefit. ?What side effects may I notice from receiving this medication? ?Side effects that you should report to your care team as soon as possible: ?Allergic reactions--skin rash, itching, hives, swelling of the face, lips, tongue, or throat ?Swelling of the ankles, hands, or feet ?Trouble breathing ?Side effects that usually do not require medical attention (report to your care team if they continue or are bothersome): ?Diarrhea ?This list may not describe all possible side effects. Call your doctor for medical advice about side effects. You may report side effects to FDA at 1-800-FDA-1088. ?Where should I keep my medication? ?Keep out of the reach of children. ?Store at room temperature between 15 and 30 degrees C (59 and 85 degrees F). Protect from light. Throw away any unused medication after the expiration date. ?NOTE: This sheet is a summary. It may not cover all possible information. If you have questions about this medicine, talk to your doctor, pharmacist, or health care provider. ?? 2022 Elsevier/Gold Standard (2021-02-01 00:00:00) ? ?

## 2022-04-10 ENCOUNTER — Inpatient Hospital Stay: Payer: Medicare Other | Attending: Oncology

## 2022-04-10 VITALS — BP 134/86 | HR 82 | Temp 98.3°F | Resp 20 | Ht 63.5 in | Wt 145.2 lb

## 2022-04-10 DIAGNOSIS — E538 Deficiency of other specified B group vitamins: Secondary | ICD-10-CM

## 2022-04-10 DIAGNOSIS — D510 Vitamin B12 deficiency anemia due to intrinsic factor deficiency: Secondary | ICD-10-CM | POA: Diagnosis present

## 2022-04-10 MED ORDER — CYANOCOBALAMIN 1000 MCG/ML IJ SOLN
1000.0000 ug | Freq: Once | INTRAMUSCULAR | Status: AC
  Administered 2022-04-10: 1000 ug via INTRAMUSCULAR
  Filled 2022-04-10: qty 1

## 2022-04-10 NOTE — Patient Instructions (Signed)
Vitamin B12 Injection ?What is this medication? ?Vitamin B12 (VAHY tuh min B12) prevents and treats low vitamin B12 levels in your body. It is used in people who do not get enough vitamin B12 from their diet or when their digestive tract does not absorb enough. Vitamin B12 plays an important role in maintaining the health of your nervous system and red blood cells. ?This medicine may be used for other purposes; ask your health care provider or pharmacist if you have questions. ?COMMON BRAND NAME(S): B-12 Compliance Kit, B-12 Injection Kit, Cyomin, Dodex, LA-12, Nutri-Twelve, Physicians EZ Use B-12, Primabalt ?What should I tell my care team before I take this medication? ?They need to know if you have any of these conditions: ?Kidney disease ?Leber's disease ?Megaloblastic anemia ?An unusual or allergic reaction to cyanocobalamin, cobalt, other medications, foods, dyes, or preservatives ?Pregnant or trying to get pregnant ?Breast-feeding ?How should I use this medication? ?This medication is injected into a muscle or deeply under the skin. It is usually given in a clinic or care team's office. However, your care team may teach you how to inject yourself. Follow all instructions. ?Talk to your care team about the use of this medication in children. Special care may be needed. ?Overdosage: If you think you have taken too much of this medicine contact a poison control center or emergency room at once. ?NOTE: This medicine is only for you. Do not share this medicine with others. ?What if I miss a dose? ?If you are given your dose at a clinic or care team's office, call to reschedule your appointment. If you give your own injections, and you miss a dose, take it as soon as you can. If it is almost time for your next dose, take only that dose. Do not take double or extra doses. ?What may interact with this medication? ?Alcohol ?Colchicine ?This list may not describe all possible interactions. Give your health care  provider a list of all the medicines, herbs, non-prescription drugs, or dietary supplements you use. Also tell them if you smoke, drink alcohol, or use illegal drugs. Some items may interact with your medicine. ?What should I watch for while using this medication? ?Visit your care team regularly. You may need blood work done while you are taking this medication. ?You may need to follow a special diet. Talk to your care team. Limit your alcohol intake and avoid smoking to get the best benefit. ?What side effects may I notice from receiving this medication? ?Side effects that you should report to your care team as soon as possible: ?Allergic reactions--skin rash, itching, hives, swelling of the face, lips, tongue, or throat ?Swelling of the ankles, hands, or feet ?Trouble breathing ?Side effects that usually do not require medical attention (report to your care team if they continue or are bothersome): ?Diarrhea ?This list may not describe all possible side effects. Call your doctor for medical advice about side effects. You may report side effects to FDA at 1-800-FDA-1088. ?Where should I keep my medication? ?Keep out of the reach of children. ?Store at room temperature between 15 and 30 degrees C (59 and 85 degrees F). Protect from light. Throw away any unused medication after the expiration date. ?NOTE: This sheet is a summary. It may not cover all possible information. If you have questions about this medicine, talk to your doctor, pharmacist, or health care provider. ?? 2023 Elsevier/Gold Standard (2021-08-01 00:00:00) ? ?

## 2022-05-08 ENCOUNTER — Inpatient Hospital Stay: Payer: Medicare Other | Attending: Oncology

## 2022-05-08 VITALS — BP 134/74 | HR 81 | Temp 98.1°F | Resp 18 | Ht 63.5 in | Wt 146.0 lb

## 2022-05-08 DIAGNOSIS — D510 Vitamin B12 deficiency anemia due to intrinsic factor deficiency: Secondary | ICD-10-CM | POA: Diagnosis present

## 2022-05-08 DIAGNOSIS — E538 Deficiency of other specified B group vitamins: Secondary | ICD-10-CM

## 2022-05-08 MED ORDER — CYANOCOBALAMIN 1000 MCG/ML IJ SOLN
1000.0000 ug | Freq: Once | INTRAMUSCULAR | Status: AC
  Administered 2022-05-08: 1000 ug via INTRAMUSCULAR
  Filled 2022-05-08: qty 1

## 2022-05-08 NOTE — Patient Instructions (Signed)
Vitamin B12 Deficiency Vitamin B12 deficiency occurs when the body does not have enough of this important vitamin. The body needs this vitamin: To make red blood cells. To make DNA. This is the genetic material inside cells. To help the nerves work properly so they can carry messages from the brain to the body. Vitamin B12 deficiency can cause health problems, such as not having enough red blood cells in the blood (anemia). This can lead to nerve damage if untreated. What are the causes? This condition may be caused by: Not eating enough foods that contain vitamin B12. Not having enough stomach acid and digestive fluids to properly absorb vitamin B12 from the food that you eat. Having certain diseases that make it hard to absorb vitamin B12. These diseases include Crohn's disease, chronic pancreatitis, and cystic fibrosis. An autoimmune disorder in which the body does not make enough of a protein (intrinsic factor) within the stomach, resulting in not enough absorption of vitamin B12. Having a surgery in which part of the stomach or small intestine is removed. Taking certain medicines that make it hard for the body to absorb vitamin B12. These include: Heartburn medicines, such as antacids and proton pump inhibitors. Some medicines that are used to treat diabetes. What increases the risk? The following factors may make you more likely to develop a vitamin B12 deficiency: Being an older adult. Eating a vegetarian or vegan diet that does not include any foods that come from animals. Eating a poor diet while you are pregnant. Taking certain medicines. Having alcoholism. What are the signs or symptoms? In some cases, there are no symptoms of this condition. If the condition leads to anemia or nerve damage, various symptoms may occur, such as: Weakness. Tiredness (fatigue). Loss of appetite. Numbness or tingling in your hands and feet. Redness and burning of the tongue. Depression,  confusion, or memory problems. Trouble walking. If anemia is severe, symptoms can include: Shortness of breath. Dizziness. Rapid heart rate. How is this diagnosed? This condition may be diagnosed with a blood test to measure the level of vitamin B12 in your blood. You may also have other tests, including: A group of tests that measure certain characteristics of blood cells (complete blood count, CBC). A blood test to measure intrinsic factor. A procedure where a thin tube with a camera on the end is used to look into your stomach or intestines (endoscopy). Other tests may be needed to discover the cause of the deficiency. How is this treated? Treatment for this condition depends on the cause. This condition may be treated by: Changing your eating and drinking habits, such as: Eating more foods that contain vitamin B12. Drinking less alcohol or no alcohol. Getting vitamin B12 injections. Taking vitamin B12 supplements by mouth (orally). Your health care provider will tell you which dose is best for you. Follow these instructions at home: Eating and drinking  Include foods in your diet that come from animals and contain a lot of vitamin B12. These include: Meats and poultry. This includes beef, pork, chicken, turkey, and organ meats, such as liver. Seafood. This includes clams, rainbow trout, salmon, tuna, and haddock. Eggs. Dairy foods such as milk, yogurt, and cheese. Eat foods that have vitamin B12 added to them (are fortified), such as ready-to-eat breakfast cereals. Check the label on the package to see if a food is fortified. The items listed above may not be a complete list of foods and beverages you can eat and drink. Contact a dietitian for   more information. Alcohol use Do not drink alcohol if: Your health care provider tells you not to drink. You are pregnant, may be pregnant, or are planning to become pregnant. If you drink alcohol: Limit how much you have to: 0-1 drink a  day for women. 0-2 drinks a day for men. Know how much alcohol is in your drink. In the [place] one drink equals one 12 oz bottle of beer (355 mL), one 5 oz glass of wine (148 mL), or one 1 oz glass of hard liquor (44 mL). General instructions Get vitamin B12 injections if told to by your health care provider. Take supplements only as told by your health care provider. Follow the directions carefully. Keep all follow-up visits. This is important. Contact a health care provider if: Your symptoms come back. Your symptoms get worse or do not improve with treatment. Get help right away: You develop shortness of breath. You have a rapid heart rate. You have chest pain. You become dizzy or you faint. These symptoms may be an emergency. Get help right away. Call 911. Do not wait to see if the symptoms will go away. Do not drive yourself to the hospital. Summary Vitamin B12 deficiency occurs when the body does not have enough of this important vitamin. Common causes include not eating enough foods that contain vitamin B12, not being able to absorb vitamin B12 from the food that you eat, having a surgery in which part of the stomach or small intestine is removed, or taking certain medicines. Eat foods that have vitamin B12 in them. Treatment may include making a change in the way you eat and drink, getting vitamin B12 injections, or taking vitamin B12 supplements. This information is not intended to replace advice given to you by your health care provider. Make sure you discuss any questions you have with your health care provider. Document Revised: 07/14/2021 Document Reviewed: 07/14/2021 Elsevier Patient Education  2023 [employer]  

## 2022-05-16 ENCOUNTER — Other Ambulatory Visit: Payer: Self-pay | Admitting: Internal Medicine

## 2022-05-16 DIAGNOSIS — Z1231 Encounter for screening mammogram for malignant neoplasm of breast: Secondary | ICD-10-CM

## 2022-06-08 ENCOUNTER — Inpatient Hospital Stay: Payer: Medicare Other | Attending: Oncology

## 2022-06-08 VITALS — BP 146/75 | HR 70 | Temp 98.6°F | Resp 17 | Wt 148.0 lb

## 2022-06-08 DIAGNOSIS — E538 Deficiency of other specified B group vitamins: Secondary | ICD-10-CM

## 2022-06-08 DIAGNOSIS — D510 Vitamin B12 deficiency anemia due to intrinsic factor deficiency: Secondary | ICD-10-CM | POA: Insufficient documentation

## 2022-06-08 DIAGNOSIS — Z79899 Other long term (current) drug therapy: Secondary | ICD-10-CM | POA: Insufficient documentation

## 2022-06-08 MED ORDER — CYANOCOBALAMIN 1000 MCG/ML IJ SOLN
1000.0000 ug | Freq: Once | INTRAMUSCULAR | Status: AC
  Administered 2022-06-08: 1000 ug via INTRAMUSCULAR
  Filled 2022-06-08: qty 1

## 2022-06-08 NOTE — Patient Instructions (Signed)
Vitamin B12 Deficiency Vitamin B12 deficiency means that your body does not have enough vitamin B12. The body needs this important vitamin: To make red blood cells. To make genes (DNA). To help the nerves work. If you do not have enough vitamin B12 in your body, you can have health problems, such as not having enough red blood cells in the blood (anemia). What are the causes? Not eating enough foods that contain vitamin B12. Not being able to take in (absorb) vitamin B12 from the food that you eat. Certain diseases. A condition in which the body does not make enough of a certain protein. This results in your body not taking in enough vitamin B12. Having a surgery in which part of the stomach or small intestine is taken out. Taking medicines that make it hard for the body to take in vitamin B12. These include: Heartburn medicines. Some medicines that are used to treat diabetes. What increases the risk? Being an older adult. Eating a vegetarian or vegan diet that does not include any foods that come from animals. Not eating enough foods that contain vitamin B12 while you are pregnant. Taking certain medicines. Having alcoholism. What are the signs or symptoms? In some cases, there are no symptoms. If the condition leads to too few blood cells or nerve damage, symptoms can occur, such as: Feeling weak or tired. Not being hungry. Losing feeling (numbness) or tingling in your hands and feet. Redness and burning of the tongue. Feeling sad (depressed). Confusion or memory problems. Trouble walking. If anemia is very bad, symptoms can include: Being short of breath. Being dizzy. Having a very fast heartbeat. How is this treated? Changing the way you eat and drink, such as: Eating more foods that contain vitamin B12. Drinking little or no alcohol. Getting vitamin B12 shots. Taking vitamin B12 supplements by mouth (orally). Your doctor will tell you the dose that is best for you. Follow  these instructions at home: Eating and drinking  Eat foods that come from animals and have a lot of vitamin B12 in them. These include: Meats and poultry. This includes beef, pork, chicken, turkey, and organ meats, such as liver. Seafood, such as clams, rainbow trout, salmon, tuna, and haddock. Eggs. Dairy foods such as milk, yogurt, and cheese. Eat breakfast cereals that have vitamin B12 added to them (are fortified). Check the label. The items listed above may not be a complete list of foods and beverages you can eat and drink. Contact a dietitian for more information. Alcohol use Do not drink alcohol if: Your doctor tells you not to drink. You are pregnant, may be pregnant, or are planning to become pregnant. If you drink alcohol: Limit how much you have to: 0-1 drink a day for women. 0-2 drinks a day for men. Know how much alcohol is in your drink. In the [place] one drink equals one 12 oz bottle of beer (355 mL), one 5 oz glass of wine (148 mL), or one 1 oz glass of hard liquor (44 mL). General instructions Get any vitamin B12 shots if told by your doctor. Take supplements only as told by your doctor. Follow the directions. Keep all follow-up visits. Contact a doctor if: Your symptoms come back. Your symptoms get worse or do not get better with treatment. Get help right away if: You have trouble breathing. You have a very fast heartbeat. You have chest pain. You get dizzy. You faint. These symptoms may be an emergency. Get help right away. Call 911.   Do not wait to see if the symptoms will go away. Do not drive yourself to the hospital. Summary Vitamin B12 deficiency means that your body is not getting enough of the vitamin. In some cases, there are no symptoms of this condition. Treatment may include making a change in the way you eat and drink, getting shots, or taking supplements. Eat foods that have vitamin B12 in them. This information is not intended to replace advice  given to you by your health care provider. Make sure you discuss any questions you have with your health care provider. Document Revised: 07/14/2021 Document Reviewed: 07/14/2021 Elsevier Patient Education  2023 [employer]  

## 2022-06-20 ENCOUNTER — Other Ambulatory Visit: Payer: Self-pay | Admitting: Oncology

## 2022-06-20 ENCOUNTER — Inpatient Hospital Stay: Payer: Medicare Other

## 2022-06-20 ENCOUNTER — Encounter: Payer: Self-pay | Admitting: Oncology

## 2022-06-20 ENCOUNTER — Inpatient Hospital Stay (INDEPENDENT_AMBULATORY_CARE_PROVIDER_SITE_OTHER): Payer: Medicare Other | Admitting: Oncology

## 2022-06-20 VITALS — BP 161/85 | HR 80 | Temp 98.2°F | Resp 18 | Ht 63.5 in | Wt 143.1 lb

## 2022-06-20 DIAGNOSIS — E538 Deficiency of other specified B group vitamins: Secondary | ICD-10-CM

## 2022-06-20 DIAGNOSIS — D509 Iron deficiency anemia, unspecified: Secondary | ICD-10-CM

## 2022-06-20 DIAGNOSIS — D510 Vitamin B12 deficiency anemia due to intrinsic factor deficiency: Secondary | ICD-10-CM | POA: Diagnosis not present

## 2022-06-20 LAB — BASIC METABOLIC PANEL
BUN: 14 (ref 4–21)
CO2: 25 — AB (ref 13–22)
Chloride: 104 (ref 99–108)
Creatinine: 0.6 (ref 0.5–1.1)
Glucose: 99
Potassium: 4.1 mEq/L (ref 3.5–5.1)
Sodium: 140 (ref 137–147)

## 2022-06-20 LAB — CBC AND DIFFERENTIAL
HCT: 42 (ref 36–46)
Hemoglobin: 14 (ref 12.0–16.0)
Neutrophils Absolute: 6.79
Platelets: 212 10*3/uL (ref 150–400)
WBC: 9.7

## 2022-06-20 LAB — COMPREHENSIVE METABOLIC PANEL
Albumin: 4.6 (ref 3.5–5.0)
Calcium: 9.8 (ref 8.7–10.7)

## 2022-06-20 LAB — HEPATIC FUNCTION PANEL
ALT: 19 U/L (ref 7–35)
AST: 39 — AB (ref 13–35)
Alkaline Phosphatase: 115 (ref 25–125)
Bilirubin, Total: 0.8

## 2022-06-20 LAB — IRON AND TIBC
Iron: 87 ug/dL (ref 28–170)
Saturation Ratios: 21 % (ref 10.4–31.8)
TIBC: 412 ug/dL (ref 250–450)
UIBC: 325 ug/dL

## 2022-06-20 LAB — FERRITIN: Ferritin: 136 ng/mL (ref 11–307)

## 2022-06-20 LAB — CBC: RBC: 4.57 (ref 3.87–5.11)

## 2022-06-20 NOTE — Progress Notes (Signed)
[facility]  [address] [place],  [place]  [address] [phone]  Clinic Day: 06/20/22  Referring physician: Rosalia Hammers, MD   CHIEF COMPLAINT:  CC:   Pernicious anemia  Current Treatment:   Monthly B12 injections   HISTORY OF PRESENT ILLNESS:  Ann Curtis is a 76 y.o. female with a history of pernicious anemia diagnosed in April 2008.  This was so severe, it was almost mistaken for leukemia.  She had pancytopenia and was found to have an extremely low B12 level. Her pancytopenia resolved with B12 repletion.  She continues monthly B12 injections.  As she has a history of ovarian cancer, as well as a family history of ovarian and breast cancer, she underwent genetic testing for BRCA 1 and 2, at another institution in 2017, which did not reveal any clinically significant mutation.  When she was seen in May for routine follow-up, she had mild microcytic anemia and was found to be iron deficient.  She did not have any obvious source of blood loss.  Stool hemoccults x3 were negative. Urinalysis did not reveal any occult blood.  She had a colonoscopy in February with removal of a tubular adenoma.  I suggestions she see Dr. Comer Locket, her gastroenterologist, for consideration of EGD.  I recommended she receive IV iron replacement in the form of Feraheme, but she wanted to discuss this with her primary care provider, Dr. Newt Lukes. She proceeded with IV Feraheme in June.  She has continued monthly B12 injections.  INTERVAL HISTORY:  Ann Curtis is here today for repeat clinical assessment. She continues B12 monthly.  She denies progressive fatigue concerning for recurrent anemia. She denies melena, hematochezia or other overt form of blood loss. She denies heartburn, nausea, vomiting, diarrhea, constipation or abdominal pain.  I will recheck iron studies.  She denies fevers or chills. She denies pain. Her appetite is good. Her weight has been stable.  REVIEW OF  SYSTEMS:  Review of Systems  Constitutional:  Negative for appetite change, chills, fatigue, fever and unexpected weight change.  HENT:   Negative for lump/mass, mouth sores and sore throat.   Respiratory:  Negative for cough and shortness of breath.   Cardiovascular:  Negative for chest pain and leg swelling.  Gastrointestinal:  Negative for abdominal pain, constipation, diarrhea, nausea and vomiting.  Endocrine: Negative for hot flashes.  Genitourinary:  Negative for difficulty urinating, dysuria, frequency and hematuria.   Musculoskeletal:  Negative for arthralgias, back pain and myalgias.  Skin:  Negative for rash.  Neurological:  Negative for dizziness and headaches.  Hematological:  Negative for adenopathy. Does not bruise/bleed easily.  Psychiatric/Behavioral:  Negative for depression and sleep disturbance. The patient is not nervous/anxious.     VITALS:  Blood pressure (!) 161/85, pulse 80, temperature 98.2 F (36.8 C), temperature source Oral, resp. rate 18, height 5' 3.5" (1.613 m), weight 143 lb 1.6 oz (64.9 kg), SpO2 98 %.  Wt Readings from Last 3 Encounters:  07/06/22 140 lb (63.5 kg)  06/20/22 143 lb 1.6 oz (64.9 kg)  06/08/22 148 lb (67.1 kg)    Body mass index is 24.95 kg/m.  Performance status (ECOG): 0 - Asymptomatic  PHYSICAL EXAM:  Physical Exam Vitals and nursing note reviewed.  Constitutional:      General: She is not in acute distress.    Appearance: Normal appearance.  HENT:     Head: Normocephalic and atraumatic.     Mouth/Throat:     Mouth: Mucous membranes  are moist.     Pharynx: Oropharynx is clear. No oropharyngeal exudate or posterior oropharyngeal erythema.  Eyes:     General: No scleral icterus.    Extraocular Movements: Extraocular movements intact.     Conjunctiva/sclera: Conjunctivae normal.     Pupils: Pupils are equal, round, and reactive to light.  Cardiovascular:     Rate and Rhythm: Normal rate and regular rhythm.     Heart  sounds: Normal heart sounds. No murmur heard.    No friction rub. No gallop.  Pulmonary:     Effort: Pulmonary effort is normal.     Breath sounds: Normal breath sounds. No wheezing, rhonchi or rales.  Abdominal:     General: There is no distension.     Palpations: Abdomen is soft. There is no hepatomegaly, splenomegaly or mass.     Tenderness: There is no abdominal tenderness.  Musculoskeletal:        General: Normal range of motion.     Cervical back: Normal range of motion and neck supple. No tenderness.     Right lower leg: No edema.     Left lower leg: No edema.  Lymphadenopathy:     Cervical: No cervical adenopathy.     Upper Body:     Right upper body: No supraclavicular or axillary adenopathy.     Left upper body: No supraclavicular or axillary adenopathy.     Lower Body: No right inguinal adenopathy. No left inguinal adenopathy.  Skin:    General: Skin is warm and dry.     Coloration: Skin is not jaundiced.     Findings: No rash.  Neurological:     Mental Status: She is alert and oriented to person, place, and time.     Cranial Nerves: No cranial nerve deficit.  Psychiatric:        Mood and Affect: Mood normal.        Behavior: Behavior normal.        Thought Content: Thought content normal.    LABS:      Latest Ref Rng & Units 06/20/2022   12:00 AM 06/15/2021   12:00 AM 04/20/2021   12:00 AM  CBC  WBC  9.7     7.6       7.6  7.8   Hemoglobin 12.0 - 16.0 14.0     13.8       13.8  11.2   Hematocrit 36 - 46 42     42       42  35   Platelets 150 - 400 K/uL 212     199       199  227      This result is from an external source.   Multiple values from one day are sorted in reverse-chronological order      Latest Ref Rng & Units 06/20/2022   12:00 AM 06/15/2021   12:00 AM 04/20/2021   12:00 AM  CMP  BUN 4 - 21 14     14       14  12    Creatinine 0.5 - 1.1 0.6     0.7       0.7  0.6   Sodium 137 - 147 140     141       141  138   Potassium 3.5 - 5.1 mEq/L  4.1     4.0       4.0  3.7   Chloride 99 - 108 104  103       103  107   CO2 13 - 22 25     27       27  23    Calcium 8.7 - 10.7 9.8     10.0       10.0  9.4   Alkaline Phos 25 - 125 115     105       105  106   AST 13 - 35 39     39       39  36   ALT 7 - 35 U/L 19     19       19  14       This result is from an external source.   Multiple values from one day are sorted in reverse-chronological order     MCV is 77  No results found for: "CEA1", "CEA" / No results found for: "CEA1", "CEA" No results found for: "PSA1" No results found for: "KPV374" No results found for: "CAN125"  No results found for: "TOTALPROTELP", "ALBUMINELP", "A1GS", "A2GS", "BETS", "BETA2SER", "GAMS", "MSPIKE", "SPEI" Lab Results  Component Value Date   TIBC 412 06/20/2022   TIBC 432 06/15/2021   TIBC 566 (H) 04/20/2021   FERRITIN 136 06/20/2022   FERRITIN 356 (H) 06/15/2021   FERRITIN 7 (L) 04/20/2021   IRONPCTSAT 21 06/20/2022   IRONPCTSAT 41 (H) 06/15/2021   IRONPCTSAT 5 (L) 04/20/2021   No results found for: "LDH"  STUDIES:  MM 3D SCREEN BREAST BILATERAL  Addendum Date: 07/11/2022   ADDENDUM REPORT: 07/11/2022 10:04 ADDENDUM: Recommendation: Screening mammogram in one year.(Code:[id number]) Electronically Signed   By: Ammie Ferrier M.D.   On: 07/11/2022 10:04   Result Date: 07/11/2022 CLINICAL DATA:  Screening. EXAM: DIGITAL SCREENING BILATERAL MAMMOGRAM WITH TOMOSYNTHESIS AND CAD COMPARISON:  Previous exam(s). ACR Breast Density Category c: The breast tissue is heterogeneously dense, which may obscure small masses. FINDINGS: There are no findings suspicious for malignancy. IMPRESSION: No mammographic evidence of malignancy. A result letter of this screening mammogram will be mailed directly to the patient. BI-RADS CATEGORY  1: Negative. Electronically Signed: By: Ammie Ferrier M.D. On: 07/11/2022 09:32      HISTORY:   Past Medical History:  Diagnosis Date   Pernicious anemia     Pernicious anemia     No past surgical history on file.  No family history on file.  Social History:  reports that she has never smoked. She has never used smokeless tobacco. She reports that she does not currently use alcohol. She reports that she does not use drugs.The patient is alone today.  Allergies:  Allergies  Allergen Reactions   Codeine Nausea Only    UNKNOWN    Current Medications: Current Outpatient Medications  Medication Sig Dispense Refill   aspirin 81 MG EC tablet Take by mouth.     aspirin-acetaminophen-caffeine (EXCEDRIN MIGRAINE) 250-250-65 MG tablet Take by mouth.     Cholecalciferol 25 MCG (1000 UT) tablet Take by mouth.     cyanocobalamin (,VITAMIN B-12,) 1000 MCG/ML injection Inject into the muscle.     loratadine (CLARITIN) 10 MG tablet Take by mouth.     simvastatin (ZOCOR) 20 MG tablet Take 20 mg by mouth at bedtime.     simvastatin (ZOCOR) 20 MG tablet Take 1 tablet by mouth at bedtime.     sodium chloride (OCEAN) 0.65 % SOLN nasal spray Place 1 spray into both nostrils as needed for congestion.  VITAMIN B1-B12 IM Inject 1,000 mcg into the muscle every 30 (thirty) days.     YUVAFEM 10 MCG TABS vaginal tablet Place vaginally.     No current facility-administered medications for this visit.     ASSESSMENT & PLAN:   Assessment:   1. Pernicious anemia, she will continue B12 injections monthly.  2.  Iron deficiency anemia diagnosed in May.  This has resolved with IV iron replacement.  We will recheck iron studies today.  3.  Mild elevation of the AST, likely medication effect of simvastatin.  This has nearly normalized.   Plan:  As she sees Dr. Newt Lukes regularly with labs, we will plan to see her back in 1 year with a CBC and comprehensive metabolic panel. If she has recurrent anemia, we will be glad to see her sooner. She will continue B12 monthly.  I will let her know if she needs further iron.  The patient understands the plans discussed today  and is in agreement with them. She knows to contact our office if there is any decrease in her hemoglobin or she has other concerns prior to her next appointment.   ADDENDUM: Her repeat iron studies are normal.    Derwood Kaplan, MD

## 2022-07-04 ENCOUNTER — Ambulatory Visit
Admission: RE | Admit: 2022-07-04 | Discharge: 2022-07-04 | Disposition: A | Discharge status: Home / Self Care | Payer: Medicare Other | Source: Ambulatory Visit | Attending: Internal Medicine | Admitting: Internal Medicine

## 2022-07-04 DIAGNOSIS — Z1231 Encounter for screening mammogram for malignant neoplasm of breast: Secondary | ICD-10-CM

## 2022-07-06 ENCOUNTER — Inpatient Hospital Stay: Payer: Medicare Other | Attending: Oncology

## 2022-07-06 VITALS — BP 139/79 | HR 74 | Temp 98.8°F | Resp 18 | Ht 63.5 in | Wt 140.0 lb

## 2022-07-06 DIAGNOSIS — Z79899 Other long term (current) drug therapy: Secondary | ICD-10-CM | POA: Insufficient documentation

## 2022-07-06 DIAGNOSIS — D510 Vitamin B12 deficiency anemia due to intrinsic factor deficiency: Secondary | ICD-10-CM | POA: Insufficient documentation

## 2022-07-06 DIAGNOSIS — E538 Deficiency of other specified B group vitamins: Secondary | ICD-10-CM

## 2022-07-06 MED ORDER — CYANOCOBALAMIN 1000 MCG/ML IJ SOLN
1000.0000 ug | Freq: Once | INTRAMUSCULAR | Status: AC
  Administered 2022-07-06: 1000 ug via INTRAMUSCULAR
  Filled 2022-07-06: qty 1

## 2022-07-06 NOTE — Patient Instructions (Signed)
Vitamin B12 Injection What is this medication? Vitamin B12 (VAHY tuh min B12) prevents and treats low vitamin B12 levels in your body. It is used in people who do not get enough vitamin B12 from their diet or when their digestive tract does not absorb enough. Vitamin B12 plays an important role in maintaining the health of your nervous system and red blood cells. This medicine may be used for other purposes; ask your health care provider or pharmacist if you have questions. COMMON BRAND NAME(S): B-12 Compliance Kit, B-12 Injection Kit, Cyomin, Dodex, LA-12, Nutri-Twelve, Physicians EZ Use B-12, Primabalt What should I tell my care team before I take this medication? They need to know if you have any of these conditions: Kidney disease Leber's disease Megaloblastic anemia An unusual or allergic reaction to cyanocobalamin, cobalt, other medications, foods, dyes, or preservatives Pregnant or trying to get pregnant Breast-feeding How should I use this medication? This medication is injected into a muscle or deeply under the skin. It is usually given in a clinic or care team's office. However, your care team may teach you how to inject yourself. Follow all instructions. Talk to your care team about the use of this medication in children. Special care may be needed. Overdosage: If you think you have taken too much of this medicine contact a poison control center or emergency room at once. NOTE: This medicine is only for you. Do not share this medicine with others. What if I miss a dose? If you are given your dose at a clinic or care team's office, call to reschedule your appointment. If you give your own injections, and you miss a dose, take it as soon as you can. If it is almost time for your next dose, take only that dose. Do not take double or extra doses. What may interact with this medication? Alcohol Colchicine This list may not describe all possible interactions. Give your health care  provider a list of all the medicines, herbs, non-prescription drugs, or dietary supplements you use. Also tell them if you smoke, drink alcohol, or use illegal drugs. Some items may interact with your medicine. What should I watch for while using this medication? Visit your care team regularly. You may need blood work done while you are taking this medication. You may need to follow a special diet. Talk to your care team. Limit your alcohol intake and avoid smoking to get the best benefit. What side effects may I notice from receiving this medication? Side effects that you should report to your care team as soon as possible: Allergic reactions--skin rash, itching, hives, swelling of the face, lips, tongue, or throat Swelling of the ankles, hands, or feet Trouble breathing Side effects that usually do not require medical attention (report to your care team if they continue or are bothersome): Diarrhea This list may not describe all possible side effects. Call your doctor for medical advice about side effects. You may report side effects to FDA at 1-800-FDA-1088. Where should I keep my medication? Keep out of the reach of children. Store at room temperature between 15 and 30 degrees C (59 and 85 degrees F). Protect from light. Throw away any unused medication after the expiration date. NOTE: This sheet is a summary. It may not cover all possible information. If you have questions about this medicine, talk to your doctor, pharmacist, or health care provider.  2023 [employer]vier/Gold Standard (2021-01-26 00:00:00)

## 2022-07-18 ENCOUNTER — Encounter: Payer: Self-pay | Admitting: Oncology

## 2022-08-03 ENCOUNTER — Inpatient Hospital Stay: Payer: Medicare Other | Attending: Oncology

## 2022-08-03 VITALS — BP 140/81 | HR 77 | Temp 98.2°F | Resp 18 | Ht 63.5 in | Wt 140.0 lb

## 2022-08-03 DIAGNOSIS — E538 Deficiency of other specified B group vitamins: Secondary | ICD-10-CM

## 2022-08-03 DIAGNOSIS — D510 Vitamin B12 deficiency anemia due to intrinsic factor deficiency: Secondary | ICD-10-CM | POA: Insufficient documentation

## 2022-08-03 DIAGNOSIS — Z79899 Other long term (current) drug therapy: Secondary | ICD-10-CM | POA: Insufficient documentation

## 2022-08-03 MED ORDER — CYANOCOBALAMIN 1000 MCG/ML IJ SOLN
1000.0000 ug | Freq: Once | INTRAMUSCULAR | Status: AC
  Administered 2022-08-03: 1000 ug via INTRAMUSCULAR
  Filled 2022-08-03: qty 1

## 2022-08-03 NOTE — Patient Instructions (Signed)
Vitamin B12 Deficiency Vitamin B12 deficiency means that your body does not have enough vitamin B12. The body needs this important vitamin: To make red blood cells. To make genes (DNA). To help the nerves work. If you do not have enough vitamin B12 in your body, you can have health problems, such as not having enough red blood cells in the blood (anemia). What are the causes? Not eating enough foods that contain vitamin B12. Not being able to take in (absorb) vitamin B12 from the food that you eat. Certain diseases. A condition in which the body does not make enough of a certain protein. This results in your body not taking in enough vitamin B12. Having a surgery in which part of the stomach or small intestine is taken out. Taking medicines that make it hard for the body to take in vitamin B12. These include: Heartburn medicines. Some medicines that are used to treat diabetes. What increases the risk? Being an older adult. Eating a vegetarian or vegan diet that does not include any foods that come from animals. Not eating enough foods that contain vitamin B12 while you are pregnant. Taking certain medicines. Having alcoholism. What are the signs or symptoms? In some cases, there are no symptoms. If the condition leads to too few blood cells or nerve damage, symptoms can occur, such as: Feeling weak or tired. Not being hungry. Losing feeling (numbness) or tingling in your hands and feet. Redness and burning of the tongue. Feeling sad (depressed). Confusion or memory problems. Trouble walking. If anemia is very bad, symptoms can include: Being short of breath. Being dizzy. Having a very fast heartbeat. How is this treated? Changing the way you eat and drink, such as: Eating more foods that contain vitamin B12. Drinking little or no alcohol. Getting vitamin B12 shots. Taking vitamin B12 supplements by mouth (orally). Your doctor will tell you the dose that is best for you. Follow  these instructions at home: Eating and drinking  Eat foods that come from animals and have a lot of vitamin B12 in them. These include: Meats and poultry. This includes beef, pork, chicken, turkey, and organ meats, such as liver. Seafood, such as clams, rainbow trout, salmon, tuna, and haddock. Eggs. Dairy foods such as milk, yogurt, and cheese. Eat breakfast cereals that have vitamin B12 added to them (are fortified). Check the label. The items listed above may not be a complete list of foods and beverages you can eat and drink. Contact a dietitian for more information. Alcohol use Do not drink alcohol if: Your doctor tells you not to drink. You are pregnant, may be pregnant, or are planning to become pregnant. If you drink alcohol: Limit how much you have to: 0-1 drink a day for women. 0-2 drinks a day for men. Know how much alcohol is in your drink. In the [place] one drink equals one 12 oz bottle of beer (355 mL), one 5 oz glass of wine (148 mL), or one 1 oz glass of hard liquor (44 mL). General instructions Get any vitamin B12 shots if told by your doctor. Take supplements only as told by your doctor. Follow the directions. Keep all follow-up visits. Contact a doctor if: Your symptoms come back. Your symptoms get worse or do not get better with treatment. Get help right away if: You have trouble breathing. You have a very fast heartbeat. You have chest pain. You get dizzy. You faint. These symptoms may be an emergency. Get help right away. Call 911.   Do not wait to see if the symptoms will go away. Do not drive yourself to the hospital. Summary Vitamin B12 deficiency means that your body is not getting enough of the vitamin. In some cases, there are no symptoms of this condition. Treatment may include making a change in the way you eat and drink, getting shots, or taking supplements. Eat foods that have vitamin B12 in them. This information is not intended to replace advice  given to you by your health care provider. Make sure you discuss any questions you have with your health care provider. Document Revised: 07/14/2021 Document Reviewed: 07/14/2021 Elsevier Patient Education  2023 [employer]  

## 2022-08-31 ENCOUNTER — Inpatient Hospital Stay: Payer: Medicare Other

## 2022-08-31 DIAGNOSIS — E538 Deficiency of other specified B group vitamins: Secondary | ICD-10-CM

## 2022-08-31 DIAGNOSIS — D510 Vitamin B12 deficiency anemia due to intrinsic factor deficiency: Secondary | ICD-10-CM | POA: Diagnosis not present

## 2022-08-31 MED ORDER — CYANOCOBALAMIN 1000 MCG/ML IJ SOLN
1000.0000 ug | Freq: Once | INTRAMUSCULAR | Status: AC
  Administered 2022-08-31: 1000 ug via INTRAMUSCULAR
  Filled 2022-08-31: qty 1

## 2022-09-27 ENCOUNTER — Other Ambulatory Visit: Payer: Self-pay | Admitting: Pharmacist

## 2022-09-28 ENCOUNTER — Ambulatory Visit: Payer: Medicare Other

## 2022-09-28 ENCOUNTER — Encounter: Payer: Self-pay | Admitting: Oncology

## 2022-09-28 ENCOUNTER — Inpatient Hospital Stay: Payer: Medicare Other | Attending: Oncology

## 2022-09-28 VITALS — BP 157/77 | HR 78 | Temp 97.7°F | Resp 18 | Ht 63.5 in | Wt 146.1 lb

## 2022-09-28 DIAGNOSIS — D510 Vitamin B12 deficiency anemia due to intrinsic factor deficiency: Secondary | ICD-10-CM | POA: Diagnosis present

## 2022-09-28 DIAGNOSIS — E538 Deficiency of other specified B group vitamins: Secondary | ICD-10-CM

## 2022-09-28 MED ORDER — CYANOCOBALAMIN 1000 MCG/ML IJ SOLN
1000.0000 ug | Freq: Once | INTRAMUSCULAR | Status: AC
  Administered 2022-09-28: 1000 ug via INTRAMUSCULAR
  Filled 2022-09-28: qty 1

## 2022-09-28 NOTE — Patient Instructions (Signed)

## 2022-10-23 ENCOUNTER — Encounter: Payer: Self-pay | Admitting: Oncology

## 2022-10-24 ENCOUNTER — Inpatient Hospital Stay: Payer: Medicare Other | Attending: Oncology

## 2022-10-24 VITALS — BP 145/84 | HR 63 | Temp 98.0°F | Resp 18 | Ht 63.5 in | Wt 146.0 lb

## 2022-10-24 DIAGNOSIS — E538 Deficiency of other specified B group vitamins: Secondary | ICD-10-CM

## 2022-10-24 DIAGNOSIS — D510 Vitamin B12 deficiency anemia due to intrinsic factor deficiency: Secondary | ICD-10-CM | POA: Insufficient documentation

## 2022-10-24 DIAGNOSIS — Z79899 Other long term (current) drug therapy: Secondary | ICD-10-CM | POA: Diagnosis not present

## 2022-10-24 MED ORDER — CYANOCOBALAMIN 1000 MCG/ML IJ SOLN
1000.0000 ug | Freq: Once | INTRAMUSCULAR | Status: AC
  Administered 2022-10-24: 1000 ug via INTRAMUSCULAR
  Filled 2022-10-24: qty 1

## 2022-10-26 ENCOUNTER — Ambulatory Visit: Payer: Medicare Other

## 2022-11-23 ENCOUNTER — Inpatient Hospital Stay: Payer: Medicare Other | Attending: Oncology

## 2022-11-23 VITALS — BP 140/81 | HR 77 | Temp 98.3°F | Resp 16 | Ht 63.5 in | Wt 143.0 lb

## 2022-11-23 DIAGNOSIS — D510 Vitamin B12 deficiency anemia due to intrinsic factor deficiency: Secondary | ICD-10-CM | POA: Diagnosis not present

## 2022-11-23 DIAGNOSIS — E538 Deficiency of other specified B group vitamins: Secondary | ICD-10-CM

## 2022-11-23 MED ORDER — CYANOCOBALAMIN 1000 MCG/ML IJ SOLN
1000.0000 ug | Freq: Once | INTRAMUSCULAR | Status: AC
  Administered 2022-11-23: 1000 ug via INTRAMUSCULAR
  Filled 2022-11-23: qty 1

## 2022-12-18 ENCOUNTER — Encounter: Payer: Self-pay | Admitting: Oncology

## 2022-12-24 ENCOUNTER — Inpatient Hospital Stay: Payer: Medicare Other | Attending: Oncology

## 2022-12-24 ENCOUNTER — Encounter: Payer: Self-pay | Admitting: Oncology

## 2022-12-24 VITALS — BP 166/90 | HR 84 | Temp 98.4°F | Resp 18 | Ht 63.5 in | Wt 147.1 lb

## 2022-12-24 DIAGNOSIS — D510 Vitamin B12 deficiency anemia due to intrinsic factor deficiency: Secondary | ICD-10-CM | POA: Diagnosis present

## 2022-12-24 DIAGNOSIS — E538 Deficiency of other specified B group vitamins: Secondary | ICD-10-CM

## 2022-12-24 MED ORDER — CYANOCOBALAMIN 1000 MCG/ML IJ SOLN
1000.0000 ug | Freq: Once | INTRAMUSCULAR | Status: AC
  Administered 2022-12-24: 1000 ug via INTRAMUSCULAR
  Filled 2022-12-24: qty 1

## 2022-12-24 NOTE — Patient Instructions (Signed)
Vitamin B12 Deficiency Vitamin B12 deficiency occurs when the body does not have enough of this important vitamin. The body needs this vitamin: To make red blood cells. To make DNA. This is the genetic material inside cells. To help the nerves work properly so they can carry messages from the brain to the body. Vitamin B12 deficiency can cause health problems, such as not having enough red blood cells in the blood (anemia). This can lead to nerve damage if untreated. What are the causes? This condition may be caused by: Not eating enough foods that contain vitamin B12. Not having enough stomach acid and digestive fluids to properly absorb vitamin B12 from the food that you eat. Having certain diseases that make it hard to absorb vitamin B12. These diseases include Crohn's disease, chronic pancreatitis, and cystic fibrosis. An autoimmune disorder in which the body does not make enough of a protein (intrinsic factor) within the stomach, resulting in not enough absorption of vitamin B12. Having a surgery in which part of the stomach or small intestine is removed. Taking certain medicines that make it hard for the body to absorb vitamin B12. These include: Heartburn medicines, such as antacids and proton pump inhibitors. Some medicines that are used to treat diabetes. What increases the risk? The following factors may make you more likely to develop a vitamin B12 deficiency: Being an older adult. Eating a vegetarian or vegan diet that does not include any foods that come from animals. Eating a poor diet while you are pregnant. Taking certain medicines. Having alcoholism. What are the signs or symptoms? In some cases, there are no symptoms of this condition. If the condition leads to anemia or nerve damage, various symptoms may occur, such as: Weakness. Tiredness (fatigue). Loss of appetite. Numbness or tingling in your hands and feet. Redness and burning of the tongue. Depression,  confusion, or memory problems. Trouble walking. If anemia is severe, symptoms can include: Shortness of breath. Dizziness. Rapid heart rate. How is this diagnosed? This condition may be diagnosed with a blood test to measure the level of vitamin B12 in your blood. You may also have other tests, including: A group of tests that measure certain characteristics of blood cells (complete blood count, CBC). A blood test to measure intrinsic factor. A procedure where a thin tube with a camera on the end is used to look into your stomach or intestines (endoscopy). Other tests may be needed to discover the cause of the deficiency. How is this treated? Treatment for this condition depends on the cause. This condition may be treated by: Changing your eating and drinking habits, such as: Eating more foods that contain vitamin B12. Drinking less alcohol or no alcohol. Getting vitamin B12 injections. Taking vitamin B12 supplements by mouth (orally). Your health care provider will tell you which dose is best for you. Follow these instructions at home: Eating and drinking  Include foods in your diet that come from animals and contain a lot of vitamin B12. These include: Meats and poultry. This includes beef, pork, chicken, turkey, and organ meats, such as liver. Seafood. This includes clams, rainbow trout, salmon, tuna, and haddock. Eggs. Dairy foods such as milk, yogurt, and cheese. Eat foods that have vitamin B12 added to them (are fortified), such as ready-to-eat breakfast cereals. Check the label on the package to see if a food is fortified. The items listed above may not be a complete list of foods and beverages you can eat and drink. Contact a dietitian for   more information. Alcohol use Do not drink alcohol if: Your health care provider tells you not to drink. You are pregnant, may be pregnant, or are planning to become pregnant. If you drink alcohol: Limit how much you have to: 0-1 drink a  day for women. 0-2 drinks a day for men. Know how much alcohol is in your drink. In the [place] one drink equals one 12 oz bottle of beer (355 mL), one 5 oz glass of wine (148 mL), or one 1 oz glass of hard liquor (44 mL). General instructions Get vitamin B12 injections if told to by your health care provider. Take supplements only as told by your health care provider. Follow the directions carefully. Keep all follow-up visits. This is important. Contact a health care provider if: Your symptoms come back. Your symptoms get worse or do not improve with treatment. Get help right away: You develop shortness of breath. You have a rapid heart rate. You have chest pain. You become dizzy or you faint. These symptoms may be an emergency. Get help right away. Call 911. Do not wait to see if the symptoms will go away. Do not drive yourself to the hospital. Summary Vitamin B12 deficiency occurs when the body does not have enough of this important vitamin. Common causes include not eating enough foods that contain vitamin B12, not being able to absorb vitamin B12 from the food that you eat, having a surgery in which part of the stomach or small intestine is removed, or taking certain medicines. Eat foods that have vitamin B12 in them. Treatment may include making a change in the way you eat and drink, getting vitamin B12 injections, or taking vitamin B12 supplements. This information is not intended to replace advice given to you by your health care provider. Make sure you discuss any questions you have with your health care provider. Document Revised: 07/14/2021 Document Reviewed: 07/14/2021 Elsevier Patient Education  2023 [employer]  

## 2023-01-24 ENCOUNTER — Inpatient Hospital Stay: Payer: Medicare Other | Attending: Oncology

## 2023-01-24 VITALS — BP 149/80 | HR 75 | Temp 98.5°F | Resp 20 | Ht 63.5 in | Wt 148.0 lb

## 2023-01-24 DIAGNOSIS — D510 Vitamin B12 deficiency anemia due to intrinsic factor deficiency: Secondary | ICD-10-CM | POA: Insufficient documentation

## 2023-01-24 DIAGNOSIS — E538 Deficiency of other specified B group vitamins: Secondary | ICD-10-CM

## 2023-01-24 MED ORDER — CYANOCOBALAMIN 1000 MCG/ML IJ SOLN
1000.0000 ug | Freq: Once | INTRAMUSCULAR | Status: AC
  Administered 2023-01-24: 1000 ug via INTRAMUSCULAR
  Filled 2023-01-24: qty 1

## 2023-01-24 NOTE — Patient Instructions (Signed)

## 2023-02-22 ENCOUNTER — Inpatient Hospital Stay: Payer: Medicare Other | Attending: Oncology

## 2023-02-22 VITALS — BP 151/81 | HR 85 | Temp 98.2°F | Resp 14 | Ht 63.5 in | Wt 148.1 lb

## 2023-02-22 DIAGNOSIS — D510 Vitamin B12 deficiency anemia due to intrinsic factor deficiency: Secondary | ICD-10-CM | POA: Insufficient documentation

## 2023-02-22 DIAGNOSIS — E538 Deficiency of other specified B group vitamins: Secondary | ICD-10-CM

## 2023-02-22 MED ORDER — CYANOCOBALAMIN 1000 MCG/ML IJ SOLN
1000.0000 ug | Freq: Once | INTRAMUSCULAR | Status: AC
  Administered 2023-02-22: 1000 ug via INTRAMUSCULAR
  Filled 2023-02-22: qty 1

## 2023-03-22 ENCOUNTER — Encounter: Payer: Self-pay | Admitting: Oncology

## 2023-03-25 ENCOUNTER — Inpatient Hospital Stay: Payer: Medicare Other | Attending: Oncology

## 2023-03-25 VITALS — BP 152/85 | HR 77 | Temp 98.5°F | Resp 18 | Ht 63.5 in | Wt 143.0 lb

## 2023-03-25 DIAGNOSIS — D510 Vitamin B12 deficiency anemia due to intrinsic factor deficiency: Secondary | ICD-10-CM | POA: Insufficient documentation

## 2023-03-25 DIAGNOSIS — E538 Deficiency of other specified B group vitamins: Secondary | ICD-10-CM

## 2023-03-25 MED ORDER — CYANOCOBALAMIN 1000 MCG/ML IJ SOLN
1000.0000 ug | Freq: Once | INTRAMUSCULAR | Status: AC
  Administered 2023-03-25: 1000 ug via INTRAMUSCULAR
  Filled 2023-03-25: qty 1

## 2023-03-25 NOTE — Patient Instructions (Signed)

## 2023-04-24 ENCOUNTER — Inpatient Hospital Stay: Payer: Medicare Other | Attending: Oncology

## 2023-04-24 VITALS — BP 141/81 | HR 70 | Resp 20 | Ht 63.5 in | Wt 144.0 lb

## 2023-04-24 DIAGNOSIS — Z79899 Other long term (current) drug therapy: Secondary | ICD-10-CM | POA: Insufficient documentation

## 2023-04-24 DIAGNOSIS — E538 Deficiency of other specified B group vitamins: Secondary | ICD-10-CM

## 2023-04-24 DIAGNOSIS — D510 Vitamin B12 deficiency anemia due to intrinsic factor deficiency: Secondary | ICD-10-CM | POA: Insufficient documentation

## 2023-04-24 MED ORDER — CYANOCOBALAMIN 1000 MCG/ML IJ SOLN
1000.0000 ug | Freq: Once | INTRAMUSCULAR | Status: AC
  Administered 2023-04-24: 1000 ug via INTRAMUSCULAR
  Filled 2023-04-24: qty 1

## 2023-05-03 MED ORDER — PROPOFOL 1000 MG/100ML IV EMUL
INTRAVENOUS | Status: AC
  Filled 2023-05-03: qty 100

## 2023-05-03 MED ORDER — LIDOCAINE HCL (PF) 2 % IJ SOLN
INTRAMUSCULAR | Status: AC
  Filled 2023-05-03: qty 5

## 2023-05-03 MED ORDER — FENTANYL CITRATE (PF) 100 MCG/2ML IJ SOLN
INTRAMUSCULAR | Status: AC
  Filled 2023-05-03: qty 2

## 2023-05-03 MED ORDER — MIDAZOLAM HCL 2 MG/2ML IJ SOLN
INTRAMUSCULAR | Status: AC
  Filled 2023-05-03: qty 2

## 2023-05-24 ENCOUNTER — Inpatient Hospital Stay: Payer: Medicare Other | Attending: Oncology

## 2023-05-24 VITALS — BP 139/82 | HR 73 | Temp 99.2°F | Resp 16 | Ht 63.5 in | Wt 144.0 lb

## 2023-05-24 DIAGNOSIS — Z79899 Other long term (current) drug therapy: Secondary | ICD-10-CM | POA: Insufficient documentation

## 2023-05-24 DIAGNOSIS — D510 Vitamin B12 deficiency anemia due to intrinsic factor deficiency: Secondary | ICD-10-CM | POA: Diagnosis present

## 2023-05-24 DIAGNOSIS — E538 Deficiency of other specified B group vitamins: Secondary | ICD-10-CM

## 2023-05-24 MED ORDER — CYANOCOBALAMIN 1000 MCG/ML IJ SOLN
1000.0000 ug | Freq: Once | INTRAMUSCULAR | Status: AC
  Administered 2023-05-24: 1000 ug via INTRAMUSCULAR
  Filled 2023-05-24: qty 1

## 2023-06-13 NOTE — Progress Notes (Addendum)
Addendum: Her labs show that her hemoglobin is 14.0 but her tests show she is iron deficient with a saturation of 9% so I will recommend oral iron supplement.  Her potassium was mildly low at 3.4 so I will recommend that she increase that in her diet.   [facility]  [address] [place],  [place]  [address] [phone]  Clinic Day: 06/20/23  Referring physician: Almyra Deforest, MD  CHIEF COMPLAINT:  CC:   Pernicious anemia  Current Treatment:   Monthly B12 injections  HISTORY OF PRESENT ILLNESS:  Ann Curtis is a 77 y.o. female with a history of pernicious anemia diagnosed in April 2008.  This was so severe, it was almost mistaken for leukemia.  She had pancytopenia and was found to have an extremely low B12 level. Her pancytopenia resolved with B12 repletion.  She continues monthly B12 injections.  As she has a history of ovarian cancer, as well as a family history of ovarian and breast cancer, she underwent genetic testing for BRCA 1 and 2, at another institution in 2017, which did not reveal any clinically significant mutation.  When she was seen in May for routine follow-up, she had mild microcytic anemia and was found to be iron deficient.  She did not have any obvious source of blood loss.  Stool hemoccults x3 were negative. Urinalysis did not reveal any occult blood.  She had a colonoscopy in February with removal of a tubular adenoma.  I suggestions she see Dr. Barbie Banner, her gastroenterologist, for consideration of EGD.  I recommended she receive IV iron replacement in the form of Feraheme, but she wanted to discuss this with her primary care provider, Dr. Loistine Chance. She proceeded with IV Feraheme in June.  She has continued monthly B12 injections.  INTERVAL HISTORY:  Ann Curtis is here today for repeat clinical assessment for her Pernicious anemia. Patient states that she feels well and has no complaints of pain. Her BP was elevated at 180/93 and  when rechecked was 161/80 at today's appointment. Patient informed me that her BP usually rises when she goes to the doctors and her BP yesterday at home was 125/80. She receives monthly B12 injections. Her labs today are pending including iron studies and ferritin. I will see her back in 1 year with CBC and CMP. She denies signs of infection such as sore throat, sinus drainage, cough, or urinary symptoms.  She denies fevers or recurrent chills. She denies pain. She denies nausea, vomiting, chest pain, dyspnea or cough. Her appetite is good and her weight has decreased 3 pounds over last month .  REVIEW OF SYSTEMS:  Review of Systems  Constitutional: Negative.  Negative for appetite change, chills, diaphoresis, fatigue, fever and unexpected weight change.  HENT:  Negative.  Negative for hearing loss, lump/mass, mouth sores, nosebleeds, sore throat, tinnitus, trouble swallowing and voice change.   Eyes: Negative.  Negative for eye problems and icterus.  Respiratory: Negative.  Negative for chest tightness, cough, hemoptysis, shortness of breath and wheezing.   Cardiovascular: Negative.  Negative for chest pain, leg swelling and palpitations.  Gastrointestinal: Negative.  Negative for abdominal distention, abdominal pain, blood in stool, constipation, diarrhea, nausea, rectal pain and vomiting.  Endocrine: Negative.  Negative for hot flashes.  Genitourinary: Negative.  Negative for bladder incontinence, difficulty urinating, dyspareunia, dysuria, frequency, hematuria, menstrual problem, nocturia, pelvic pain, vaginal bleeding and vaginal discharge.   Musculoskeletal:  Negative for arthralgias, back pain, flank pain, gait problem, myalgias,  neck pain and neck stiffness.  Skin: Negative.  Negative for itching, rash and wound.  Neurological:  Negative for dizziness, extremity weakness, gait problem, headaches, light-headedness, numbness, seizures and speech difficulty.  Hematological: Negative.  Negative  for adenopathy. Does not bruise/bleed easily.  Psychiatric/Behavioral: Negative.  Negative for confusion, decreased concentration, depression, sleep disturbance and suicidal ideas. The patient is not nervous/anxious.     VITALS:  Blood pressure (!) 161/80, pulse 92, temperature 98.1 F (36.7 C), temperature source Oral, resp. rate 18, height 5' 3.5" (1.613 m), weight 141 lb 11.2 oz (64.3 kg), SpO2 96%.  Wt Readings from Last 3 Encounters:  06/24/23 140 lb 1.9 oz (63.6 kg)  06/20/23 141 lb 11.2 oz (64.3 kg)  05/24/23 144 lb (65.3 kg)    Body mass index is 24.71 kg/m.  Performance status (ECOG): 0 - Asymptomatic  PHYSICAL EXAM:  Physical Exam Vitals and nursing note reviewed.  Constitutional:      General: She is not in acute distress.    Appearance: Normal appearance. She is normal weight. She is not ill-appearing, toxic-appearing or diaphoretic.  HENT:     Head: Normocephalic and atraumatic.     Right Ear: Tympanic membrane, ear canal and external ear normal. There is no impacted cerumen.     Left Ear: Tympanic membrane, ear canal and external ear normal. There is no impacted cerumen.     Nose: Nose normal. No congestion or rhinorrhea.     Mouth/Throat:     Mouth: Mucous membranes are moist.     Pharynx: Oropharynx is clear. No oropharyngeal exudate or posterior oropharyngeal erythema.  Eyes:     General: No scleral icterus.       Right eye: No discharge.        Left eye: No discharge.     Extraocular Movements: Extraocular movements intact.     Conjunctiva/sclera: Conjunctivae normal.     Pupils: Pupils are equal, round, and reactive to light.  Neck:     Vascular: No carotid bruit.  Cardiovascular:     Rate and Rhythm: Normal rate and regular rhythm.     Pulses: Normal pulses.     Heart sounds: Normal heart sounds. No murmur heard.    No friction rub. No gallop.  Pulmonary:     Effort: Pulmonary effort is normal. No respiratory distress.     Breath sounds: Normal  breath sounds. No stridor. No wheezing, rhonchi or rales.  Chest:     Chest wall: No tenderness.  Abdominal:     General: Bowel sounds are normal. There is no distension.     Palpations: Abdomen is soft. There is no hepatomegaly, splenomegaly or mass.     Tenderness: There is no abdominal tenderness. There is no right CVA tenderness, left CVA tenderness, guarding or rebound.     Hernia: No hernia is present.  Musculoskeletal:        General: No swelling, tenderness, deformity or signs of injury. Normal range of motion.     Cervical back: Normal range of motion and neck supple. No rigidity or tenderness.     Right lower leg: No edema.     Left lower leg: No edema.  Lymphadenopathy:     Cervical: No cervical adenopathy.     Right cervical: No superficial, deep or posterior cervical adenopathy.    Left cervical: No superficial, deep or posterior cervical adenopathy.     Upper Body:     Right upper body: No supraclavicular, axillary or pectoral adenopathy.  Left upper body: No supraclavicular, axillary or pectoral adenopathy.     Lower Body: No right inguinal adenopathy. No left inguinal adenopathy.  Skin:    General: Skin is warm and dry.     Coloration: Skin is not jaundiced or pale.     Findings: No bruising, erythema, lesion or rash.  Neurological:     General: No focal deficit present.     Mental Status: She is alert and oriented to person, place, and time. Mental status is at baseline.     Cranial Nerves: No cranial nerve deficit.     Sensory: No sensory deficit.     Motor: No weakness.     Coordination: Coordination normal.     Gait: Gait normal.     Deep Tendon Reflexes: Reflexes normal.  Psychiatric:        Mood and Affect: Mood normal.        Behavior: Behavior normal.        Thought Content: Thought content normal.        Judgment: Judgment normal.    LABS:   Component Ref Range & Units 01/02/2023  WBC (White Blood Cell Count) 3.2 - 9.8 x10^9/L 9.5   Hemoglobin 11.7 - 15.5 g/dL 78.2  Hematocrit 95.6 - 45.0 % 42.0  Platelets 150 - 450 x10^9/L 187      Latest Ref Rng & Units 06/20/2023    9:37 AM 06/20/2022   12:00 AM 06/15/2021   12:00 AM  CBC  WBC 4.0 - 10.5 K/uL 12.1  9.7     7.6       7.6   Hemoglobin 12.0 - 15.0 g/dL 21.3  08.6     57.8       13.8   Hematocrit 36.0 - 46.0 % 42.2  42     42       42   Platelets 150 - 400 K/uL 238  212     199       199      This result is from an external source.   Multiple values from one day are sorted in reverse-chronological order      Latest Ref Rng & Units 06/20/2023    9:37 AM 06/20/2022   12:00 AM 06/15/2021   12:00 AM  CMP  Glucose 70 - 99 mg/dL 469     BUN 8 - 23 mg/dL 13  14     14       14    Creatinine 0.44 - 1.00 mg/dL 6.29  0.6     0.7       0.7   Sodium 135 - 145 mmol/L 136  140     141       141   Potassium 3.5 - 5.1 mmol/L 3.4  4.1     4.0       4.0   Chloride 98 - 111 mmol/L 102  104     103       103   CO2 22 - 32 mmol/L 23  25     27       27    Calcium 8.9 - 10.3 mg/dL 9.4  9.8     52.8       10.0   Total Protein 6.5 - 8.1 g/dL 8.4     Total Bilirubin 0.3 - 1.2 mg/dL 1.2     Alkaline Phos 38 - 126 U/L 130  115     105  105   AST 15 - 41 U/L 37  39     39       39   ALT 0 - 44 U/L 18  19     19       19       This result is from an external source.   Multiple values from one day are sorted in reverse-chronological order   No results found for: "CEA1", "CEA" / No results found for: "CEA1", "CEA" No results found for: "PSA1" No results found for: "DDU202" No results found for: "CAN125"  No results found for: "TOTALPROTELP", "ALBUMINELP", "A1GS", "A2GS", "BETS", "BETA2SER", "GAMS", "MSPIKE", "SPEI" Lab Results  Component Value Date   TIBC 400 06/20/2023   TIBC 412 06/20/2022   TIBC 432 06/15/2021   FERRITIN 152 06/20/2023   FERRITIN 136 06/20/2022   FERRITIN 356 (H) 06/15/2021   IRONPCTSAT 9 (L) 06/20/2023   IRONPCTSAT 21 06/20/2022    IRONPCTSAT 41 (H) 06/15/2021   No results found for: "LDH"  STUDIES:  No results found.     HISTORY:   Past Medical History:  Diagnosis Date   Pernicious anemia    Pernicious anemia     No past surgical history on file.  No family history on file.  Social History:  reports that she has never smoked. She has never used smokeless tobacco. She reports that she does not currently use alcohol. She reports that she does not use drugs.The patient is alone today.  Allergies:  Allergies  Allergen Reactions   Codeine Nausea Only    UNKNOWN    Current Medications: Current Outpatient Medications  Medication Sig Dispense Refill   aspirin 81 MG EC tablet Take by mouth.     aspirin-acetaminophen-caffeine (EXCEDRIN MIGRAINE) 250-250-65 MG tablet Take by mouth.     Cholecalciferol 25 MCG (1000 UT) tablet Take by mouth.     cyanocobalamin (,VITAMIN B-12,) 1000 MCG/ML injection Inject into the muscle.     loratadine (CLARITIN) 10 MG tablet Take by mouth.     simvastatin (ZOCOR) 20 MG tablet Take 20 mg by mouth at bedtime.     simvastatin (ZOCOR) 20 MG tablet Take 1 tablet by mouth at bedtime.     sodium chloride (OCEAN) 0.65 % SOLN nasal spray Place 1 spray into both nostrils as needed for congestion.     VITAMIN B1-B12 IM Inject 1,000 mcg into the muscle every 30 (thirty) days.     YUVAFEM 10 MCG TABS vaginal tablet Place vaginally.     No current facility-administered medications for this visit.     ASSESSMENT & PLAN:  Assessment:  1. Pernicious anemia, she will continue B12 injections monthly.  2.  Iron deficiency anemia diagnosed in May.  This has resolved with IV iron replacement.  We will recheck iron studies today.  3.  Mild hypokalemia.  4.  Hypertension.  She feels this is most likely whitecoat syndrome as her blood pressures are normal at home.  We will continue to monitor.   Plan:   Her BP was elevated at 180/93 and when rechecked was 161/80 at today's appointment.  Patient informed me that her BP usually rises when she goes to the doctors and her BP yesterday at home was 125/80. She receives monthly B12 injections. Her labs today are pending including iron studies and ferritin. I will see her back in 1 year with CBC and CMP.  The patient understands the plans discussed today and is in agreement with them. She  knows to contact our office if there is any decrease in her hemoglobin or she has other concerns prior to her next appointment.  I provided 11 minutes of face-to-face time during this encounter and > 50% was spent counseling as documented under my assessment and plan.   Learta Codding  [facility] AT [facility] [address] [place] [address] Dept: [phone] Dept Fax: [phone]    Rulon Sera Lassiter,acting as a scribe for Dellia Beckwith, MD.,have documented all relevant documentation on the behalf of Dellia Beckwith, MD,as directed by  Dellia Beckwith, MD while in the presence of Dellia Beckwith, MD.  Dellia Beckwith, MD

## 2023-06-20 ENCOUNTER — Inpatient Hospital Stay: Payer: Medicare Other

## 2023-06-20 ENCOUNTER — Other Ambulatory Visit: Payer: Self-pay | Admitting: Oncology

## 2023-06-20 ENCOUNTER — Telehealth: Payer: Self-pay | Admitting: Oncology

## 2023-06-20 ENCOUNTER — Inpatient Hospital Stay: Payer: Medicare Other | Attending: Oncology | Admitting: Oncology

## 2023-06-20 VITALS — BP 161/80 | HR 92 | Temp 98.1°F | Resp 18 | Ht 63.5 in | Wt 141.7 lb

## 2023-06-20 DIAGNOSIS — D509 Iron deficiency anemia, unspecified: Secondary | ICD-10-CM | POA: Diagnosis not present

## 2023-06-20 DIAGNOSIS — E538 Deficiency of other specified B group vitamins: Secondary | ICD-10-CM

## 2023-06-20 DIAGNOSIS — Z803 Family history of malignant neoplasm of breast: Secondary | ICD-10-CM | POA: Diagnosis not present

## 2023-06-20 DIAGNOSIS — E876 Hypokalemia: Secondary | ICD-10-CM

## 2023-06-20 DIAGNOSIS — Z8041 Family history of malignant neoplasm of ovary: Secondary | ICD-10-CM | POA: Insufficient documentation

## 2023-06-20 DIAGNOSIS — Z8543 Personal history of malignant neoplasm of ovary: Secondary | ICD-10-CM | POA: Insufficient documentation

## 2023-06-20 DIAGNOSIS — I158 Other secondary hypertension: Secondary | ICD-10-CM | POA: Diagnosis not present

## 2023-06-20 DIAGNOSIS — D510 Vitamin B12 deficiency anemia due to intrinsic factor deficiency: Secondary | ICD-10-CM | POA: Insufficient documentation

## 2023-06-20 DIAGNOSIS — I10 Essential (primary) hypertension: Secondary | ICD-10-CM | POA: Diagnosis not present

## 2023-06-20 LAB — CMP (CANCER CENTER ONLY)
ALT: 18 U/L (ref 0–44)
AST: 37 U/L (ref 15–41)
Albumin: 4.3 g/dL (ref 3.5–5.0)
Alkaline Phosphatase: 130 U/L — ABNORMAL HIGH (ref 38–126)
Anion gap: 11 (ref 5–15)
BUN: 13 mg/dL (ref 8–23)
CO2: 23 mmol/L (ref 22–32)
Calcium: 9.4 mg/dL (ref 8.9–10.3)
Chloride: 102 mmol/L (ref 98–111)
Creatinine: 0.68 mg/dL (ref 0.44–1.00)
GFR, Estimated: >60 mL/min (ref >60)
Glucose, Bld: 108 mg/dL — ABNORMAL HIGH (ref 70–99)
Potassium: 3.4 mmol/L — ABNORMAL LOW (ref 3.5–5.1)
Sodium: 136 mmol/L (ref 135–145)
Total Bilirubin: 1.2 mg/dL (ref 0.3–1.2)
Total Protein: 8.4 g/dL — ABNORMAL HIGH (ref 6.5–8.1)

## 2023-06-20 LAB — CBC WITH DIFFERENTIAL (CANCER CENTER ONLY)
Abs Immature Granulocytes: 0.05 10*3/uL (ref 0.00–0.07)
Basophils Absolute: 0.1 10*3/uL (ref 0.0–0.1)
Basophils Relative: 1 %
Eosinophils Absolute: 0.2 10*3/uL (ref 0.0–0.5)
Eosinophils Relative: 1 %
HCT: 42.2 % (ref 36.0–46.0)
Hemoglobin: 14 g/dL (ref 12.0–15.0)
Immature Granulocytes: 0 %
Lymphocytes Relative: 15 %
Lymphs Abs: 1.8 10*3/uL (ref 0.7–4.0)
MCH: 30.5 pg (ref 26.0–34.0)
MCHC: 33.2 g/dL (ref 30.0–36.0)
MCV: 91.9 fL (ref 80.0–100.0)
Monocytes Absolute: 0.9 10*3/uL (ref 0.1–1.0)
Monocytes Relative: 8 %
Neutro Abs: 9 10*3/uL — ABNORMAL HIGH (ref 1.7–7.7)
Neutrophils Relative %: 75 %
Platelet Count: 238 10*3/uL (ref 150–400)
RBC: 4.59 MIL/uL (ref 3.87–5.11)
RDW: 12.5 % (ref 11.5–15.5)
WBC Count: 12.1 10*3/uL — ABNORMAL HIGH (ref 4.0–10.5)
nRBC: 0 % (ref 0.0–0.2)

## 2023-06-20 LAB — IRON AND TIBC
Iron: 36 ug/dL (ref 28–170)
Saturation Ratios: 9 % — ABNORMAL LOW (ref 10.4–31.8)
TIBC: 400 ug/dL (ref 250–450)
UIBC: 364 ug/dL

## 2023-06-20 LAB — FERRITIN: Ferritin: 152 ng/mL (ref 11–307)

## 2023-06-20 NOTE — Telephone Encounter (Signed)
06/20/23 Spoke with patient and confirmed next appt.

## 2023-06-21 ENCOUNTER — Encounter: Payer: Self-pay | Admitting: Oncology

## 2023-06-21 NOTE — Addendum Note (Signed)
Addended by: Domenic Schwab on: 06/21/2023 04:32 PM   Modules accepted: Orders

## 2023-06-24 ENCOUNTER — Inpatient Hospital Stay: Payer: Medicare Other

## 2023-06-24 VITALS — BP 156/86 | HR 73 | Temp 98.4°F | Resp 16 | Ht 63.5 in | Wt 140.1 lb

## 2023-06-24 DIAGNOSIS — E538 Deficiency of other specified B group vitamins: Secondary | ICD-10-CM

## 2023-06-24 DIAGNOSIS — D510 Vitamin B12 deficiency anemia due to intrinsic factor deficiency: Secondary | ICD-10-CM | POA: Diagnosis not present

## 2023-06-24 MED ORDER — CYANOCOBALAMIN 1000 MCG/ML IJ SOLN
1000.0000 ug | Freq: Once | INTRAMUSCULAR | Status: AC
  Administered 2023-06-24: 1000 ug via INTRAMUSCULAR
  Filled 2023-06-24: qty 1

## 2023-07-03 ENCOUNTER — Encounter: Payer: Self-pay | Admitting: Oncology

## 2023-07-03 DIAGNOSIS — E876 Hypokalemia: Secondary | ICD-10-CM | POA: Insufficient documentation

## 2023-07-03 DIAGNOSIS — I10 Essential (primary) hypertension: Secondary | ICD-10-CM | POA: Insufficient documentation

## 2023-07-05 ENCOUNTER — Telehealth: Payer: Self-pay

## 2023-07-05 NOTE — Telephone Encounter (Signed)
-----   Message from Dellia Beckwith sent at 07/03/2023  4:54 PM EDT ----- Regarding: call Tell her iron is low but she is not anemic.  She may feel better if she takes some iron - 1 pill daily. Her K is low, needs to increase in diet.  The rest of the  labs look good

## 2023-07-25 ENCOUNTER — Inpatient Hospital Stay: Payer: Medicare Other | Attending: Oncology

## 2023-07-25 VITALS — BP 146/79 | HR 74 | Temp 98.4°F

## 2023-07-25 DIAGNOSIS — E538 Deficiency of other specified B group vitamins: Secondary | ICD-10-CM

## 2023-07-25 DIAGNOSIS — D510 Vitamin B12 deficiency anemia due to intrinsic factor deficiency: Secondary | ICD-10-CM | POA: Insufficient documentation

## 2023-07-25 MED ORDER — CYANOCOBALAMIN 1000 MCG/ML IJ SOLN
1000.0000 ug | Freq: Once | INTRAMUSCULAR | Status: AC
  Administered 2023-07-25: 1000 ug via INTRAMUSCULAR
  Filled 2023-07-25: qty 1

## 2023-07-25 NOTE — Patient Instructions (Signed)
 Vitamin B12 Injection What is this medication? Vitamin B12 (VAHY tuh min B12) prevents and treats low vitamin B12 levels in your body. It is used in people who do not get enough vitamin B12 from their diet or when their digestive tract does not absorb enough. Vitamin B12 plays an important role in maintaining the health of your nervous system and red blood cells. This medicine may be used for other purposes; ask your health care provider or pharmacist if you have questions. COMMON BRAND NAME(S): B-12 Compliance Kit, B-12 Injection Kit, Cyomin, Dodex, LA-12, Nutri-Twelve, Physicians EZ Use B-12, Primabalt, Vitamin Deficiency Injectable System - B12 What should I tell my care team before I take this medication? They need to know if you have any of these conditions: Kidney disease Leber's disease Megaloblastic anemia An unusual or allergic reaction to cyanocobalamin, cobalt, other medications, foods, dyes, or preservatives Pregnant or trying to get pregnant Breast-feeding How should I use this medication? This medication is injected into a muscle or deeply under the skin. It is usually given in a clinic or care team's office. However, your care team may teach you how to inject yourself. Follow all instructions. Talk to your care team about the use of this medication in children. Special care may be needed. Overdosage: If you think you have taken too much of this medicine contact a poison control center or emergency room at once. NOTE: This medicine is only for you. Do not share this medicine with others. What if I miss a dose? If you are given your dose at a clinic or care team's office, call to reschedule your appointment. If you give your own injections, and you miss a dose, take it as soon as you can. If it is almost time for your next dose, take only that dose. Do not take double or extra doses. What may interact with this medication? Alcohol Colchicine This list may not describe all possible  interactions. Give your health care provider a list of all the medicines, herbs, non-prescription drugs, or dietary supplements you use. Also tell them if you smoke, drink alcohol, or use illegal drugs. Some items may interact with your medicine. What should I watch for while using this medication? Visit your care team regularly. You may need blood work done while you are taking this medication. You may need to follow a special diet. Talk to your care team. Limit your alcohol intake and avoid smoking to get the best benefit. What side effects may I notice from receiving this medication? Side effects that you should report to your care team as soon as possible: Allergic reactions--skin rash, itching, hives, swelling of the face, lips, tongue, or throat Swelling of the ankles, hands, or feet Trouble breathing Side effects that usually do not require medical attention (report to your care team if they continue or are bothersome): Diarrhea This list may not describe all possible side effects. Call your doctor for medical advice about side effects. You may report side effects to FDA at 1-800-FDA-1088. Where should I keep my medication? Keep out of the reach of children. Store at room temperature between 15 and 30 degrees C (59 and 85 degrees F). Protect from light. Throw away any unused medication after the expiration date. NOTE: This sheet is a summary. It may not cover all possible information. If you have questions about this medicine, talk to your doctor, pharmacist, or health care provider.  2024 Elsevier/Gold Standard (2021-08-01 00:00:00)

## 2023-08-25 ENCOUNTER — Encounter: Payer: Self-pay | Admitting: Oncology

## 2023-08-25 NOTE — Addendum Note (Signed)
Addended by: Domenic Schwab on: 08/25/2023 08:46 AM   Modules accepted: Orders

## 2023-08-26 ENCOUNTER — Inpatient Hospital Stay: Payer: Medicare Other | Attending: Oncology

## 2023-08-26 VITALS — BP 166/92 | HR 86 | Temp 97.9°F | Resp 20 | Ht 63.5 in | Wt 142.0 lb

## 2023-08-26 DIAGNOSIS — D510 Vitamin B12 deficiency anemia due to intrinsic factor deficiency: Secondary | ICD-10-CM | POA: Insufficient documentation

## 2023-08-26 DIAGNOSIS — E538 Deficiency of other specified B group vitamins: Secondary | ICD-10-CM

## 2023-08-26 MED ORDER — CYANOCOBALAMIN 1000 MCG/ML IJ SOLN
1000.0000 ug | Freq: Once | INTRAMUSCULAR | Status: AC
  Administered 2023-08-26: 1000 ug via INTRAMUSCULAR
  Filled 2023-08-26: qty 1

## 2023-08-26 NOTE — Patient Instructions (Signed)
Vitamin B12 Injection What is this medication? Vitamin B12 (VAHY tuh min B12) prevents and treats low vitamin B12 levels in your body. It is used in people who do not get enough vitamin B12 from their diet or when their digestive tract does not absorb enough. Vitamin B12 plays an important role in maintaining the health of your nervous system and red blood cells. This medicine may be used for other purposes; ask your health care provider or pharmacist if you have questions. COMMON BRAND NAME(S): B-12 Compliance Kit, B-12 Injection Kit, Cyomin, Dodex, LA-12, Nutri-Twelve, Physicians EZ Use B-12, Primabalt, Vitamin Deficiency Injectable System - B12 What should I tell my care team before I take this medication? They need to know if you have any of these conditions: Kidney disease Leber's disease Megaloblastic anemia An unusual or allergic reaction to cyanocobalamin, cobalt, other medications, foods, dyes, or preservatives Pregnant or trying to get pregnant Breast-feeding How should I use this medication? This medication is injected into a muscle or deeply under the skin. It is usually given in a clinic or care team's office. However, your care team may teach you how to inject yourself. Follow all instructions. Talk to your care team about the use of this medication in children. Special care may be needed. Overdosage: If you think you have taken too much of this medicine contact a poison control center or emergency room at once. NOTE: This medicine is only for you. Do not share this medicine with others. What if I miss a dose? If you are given your dose at a clinic or care team's office, call to reschedule your appointment. If you give your own injections, and you miss a dose, take it as soon as you can. If it is almost time for your next dose, take only that dose. Do not take double or extra doses. What may interact with this medication? Alcohol Colchicine This list may not describe all possible  interactions. Give your health care provider a list of all the medicines, herbs, non-prescription drugs, or dietary supplements you use. Also tell them if you smoke, drink alcohol, or use illegal drugs. Some items may interact with your medicine. What should I watch for while using this medication? Visit your care team regularly. You may need blood work done while you are taking this medication. You may need to follow a special diet. Talk to your care team. Limit your alcohol intake and avoid smoking to get the best benefit. What side effects may I notice from receiving this medication? Side effects that you should report to your care team as soon as possible: Allergic reactions--skin rash, itching, hives, swelling of the face, lips, tongue, or throat Swelling of the ankles, hands, or feet Trouble breathing Side effects that usually do not require medical attention (report to your care team if they continue or are bothersome): Diarrhea This list may not describe all possible side effects. Call your doctor for medical advice about side effects. You may report side effects to FDA at 1-800-FDA-1088. Where should I keep my medication? Keep out of the reach of children. Store at room temperature between 15 and 30 degrees C (59 and 85 degrees F). Protect from light. Throw away any unused medication after the expiration date. NOTE: This sheet is a summary. It may not cover all possible information. If you have questions about this medicine, talk to your doctor, pharmacist, or health care provider.  2024 Elsevier/Gold Standard (2021-08-01 00:00:00)

## 2023-09-24 ENCOUNTER — Inpatient Hospital Stay: Payer: Medicare Other | Attending: Oncology

## 2023-09-24 VITALS — BP 133/91 | HR 87 | Temp 98.4°F | Resp 18 | Ht 63.5 in | Wt 137.0 lb

## 2023-09-24 DIAGNOSIS — D510 Vitamin B12 deficiency anemia due to intrinsic factor deficiency: Secondary | ICD-10-CM | POA: Diagnosis present

## 2023-09-24 DIAGNOSIS — E538 Deficiency of other specified B group vitamins: Secondary | ICD-10-CM

## 2023-09-24 MED ORDER — CYANOCOBALAMIN 1000 MCG/ML IJ SOLN
1000.0000 ug | Freq: Once | INTRAMUSCULAR | Status: AC
  Administered 2023-09-24: 1000 ug via INTRAMUSCULAR
  Filled 2023-09-24: qty 1

## 2023-09-24 NOTE — Patient Instructions (Signed)
 Vitamin B12 Injection What is this medication? Vitamin B12 (VAHY tuh min B12) prevents and treats low vitamin B12 levels in your body. It is used in people who do not get enough vitamin B12 from their diet or when their digestive tract does not absorb enough. Vitamin B12 plays an important role in maintaining the health of your nervous system and red blood cells. This medicine may be used for other purposes; ask your health care provider or pharmacist if you have questions. COMMON BRAND NAME(S): B-12 Compliance Kit, B-12 Injection Kit, Cyomin, Dodex, LA-12, Nutri-Twelve, Physicians EZ Use B-12, Primabalt, Vitamin Deficiency Injectable System - B12 What should I tell my care team before I take this medication? They need to know if you have any of these conditions: Kidney disease Leber's disease Megaloblastic anemia An unusual or allergic reaction to cyanocobalamin, cobalt, other medications, foods, dyes, or preservatives Pregnant or trying to get pregnant Breast-feeding How should I use this medication? This medication is injected into a muscle or deeply under the skin. It is usually given in a clinic or care team's office. However, your care team may teach you how to inject yourself. Follow all instructions. Talk to your care team about the use of this medication in children. Special care may be needed. Overdosage: If you think you have taken too much of this medicine contact a poison control center or emergency room at once. NOTE: This medicine is only for you. Do not share this medicine with others. What if I miss a dose? If you are given your dose at a clinic or care team's office, call to reschedule your appointment. If you give your own injections, and you miss a dose, take it as soon as you can. If it is almost time for your next dose, take only that dose. Do not take double or extra doses. What may interact with this medication? Alcohol Colchicine This list may not describe all possible  interactions. Give your health care provider a list of all the medicines, herbs, non-prescription drugs, or dietary supplements you use. Also tell them if you smoke, drink alcohol, or use illegal drugs. Some items may interact with your medicine. What should I watch for while using this medication? Visit your care team regularly. You may need blood work done while you are taking this medication. You may need to follow a special diet. Talk to your care team. Limit your alcohol intake and avoid smoking to get the best benefit. What side effects may I notice from receiving this medication? Side effects that you should report to your care team as soon as possible: Allergic reactions--skin rash, itching, hives, swelling of the face, lips, tongue, or throat Swelling of the ankles, hands, or feet Trouble breathing Side effects that usually do not require medical attention (report to your care team if they continue or are bothersome): Diarrhea This list may not describe all possible side effects. Call your doctor for medical advice about side effects. You may report side effects to FDA at 1-800-FDA-1088. Where should I keep my medication? Keep out of the reach of children. Store at room temperature between 15 and 30 degrees C (59 and 85 degrees F). Protect from light. Throw away any unused medication after the expiration date. NOTE: This sheet is a summary. It may not cover all possible information. If you have questions about this medicine, talk to your doctor, pharmacist, or health care provider.  2024 Elsevier/Gold Standard (2021-08-01 00:00:00)

## 2023-10-25 ENCOUNTER — Inpatient Hospital Stay: Payer: Medicare Other | Attending: Oncology

## 2023-10-25 VITALS — BP 163/93 | HR 77 | Temp 98.6°F | Resp 18 | Ht 63.5 in | Wt 138.0 lb

## 2023-10-25 DIAGNOSIS — D510 Vitamin B12 deficiency anemia due to intrinsic factor deficiency: Secondary | ICD-10-CM | POA: Insufficient documentation

## 2023-10-25 DIAGNOSIS — E538 Deficiency of other specified B group vitamins: Secondary | ICD-10-CM

## 2023-10-25 MED ORDER — CYANOCOBALAMIN 1000 MCG/ML IJ SOLN
1000.0000 ug | Freq: Once | INTRAMUSCULAR | Status: AC
  Administered 2023-10-25: 1000 ug via INTRAMUSCULAR

## 2023-10-25 NOTE — Patient Instructions (Signed)
Vitamin B12 Deficiency Vitamin B12 deficiency occurs when the body does not have enough of this important vitamin. The body needs this vitamin: To make red blood cells. To make DNA. This is the genetic material inside cells. To help the nerves work properly so they can carry messages from the brain to the body. Vitamin B12 deficiency can cause health problems, such as not having enough red blood cells in the blood (anemia). This can lead to nerve damage if untreated. What are the causes? This condition may be caused by: Not eating enough foods that contain vitamin B12. Not having enough stomach acid and digestive fluids to properly absorb vitamin B12 from the food that you eat. Having certain diseases that make it hard to absorb vitamin B12. These diseases include Crohn's disease, chronic pancreatitis, and cystic fibrosis. An autoimmune disorder in which the body does not make enough of a protein (intrinsic factor) within the stomach, resulting in not enough absorption of vitamin B12. Having a surgery in which part of the stomach or small intestine is removed. Taking certain medicines that make it hard for the body to absorb vitamin B12. These include: Heartburn medicines, such as antacids and proton pump inhibitors. Some medicines that are used to treat diabetes. What increases the risk? The following factors may make you more likely to develop a vitamin B12 deficiency: Being an older adult. Eating a vegetarian or vegan diet that does not include any foods that come from animals. Eating a poor diet while you are pregnant. Taking certain medicines. Having alcoholism. What are the signs or symptoms? In some cases, there are no symptoms of this condition. If the condition leads to anemia or nerve damage, various symptoms may occur, such as: Weakness. Tiredness (fatigue). Loss of appetite. Numbness or tingling in your hands and feet. Redness and burning of the tongue. Depression,  confusion, or memory problems. Trouble walking. If anemia is severe, symptoms can include: Shortness of breath. Dizziness. Rapid heart rate. How is this diagnosed? This condition may be diagnosed with a blood test to measure the level of vitamin B12 in your blood. You may also have other tests, including: A group of tests that measure certain characteristics of blood cells (complete blood count, CBC). A blood test to measure intrinsic factor. A procedure where a thin tube with a camera on the end is used to look into your stomach or intestines (endoscopy). Other tests may be needed to discover the cause of the deficiency. How is this treated? Treatment for this condition depends on the cause. This condition may be treated by: Changing your eating and drinking habits, such as: Eating more foods that contain vitamin B12. Drinking less alcohol or no alcohol. Getting vitamin B12 injections. Taking vitamin B12 supplements by mouth (orally). Your health care provider will tell you which dose is best for you. Follow these instructions at home: Eating and drinking  Include foods in your diet that come from animals and contain a lot of vitamin B12. These include: Meats and poultry. This includes beef, pork, chicken, turkey, and organ meats, such as liver. Seafood. This includes clams, rainbow trout, salmon, tuna, and haddock. Eggs. Dairy foods such as milk, yogurt, and cheese. Eat foods that have vitamin B12 added to them (are fortified), such as ready-to-eat breakfast cereals. Check the label on the package to see if a food is fortified. The items listed above may not be a complete list of foods and beverages you can eat and drink. Contact a dietitian for   more information. Alcohol use Do not drink alcohol if: Your health care provider tells you not to drink. You are pregnant, may be pregnant, or are planning to become pregnant. If you drink alcohol: Limit how much you have to: 0-1 drink a  day for women. 0-2 drinks a day for men. Know how much alcohol is in your drink. In the [place] one drink equals one 12 oz bottle of beer (355 mL), one 5 oz glass of wine (148 mL), or one 1 oz glass of hard liquor (44 mL). General instructions Get vitamin B12 injections if told to by your health care provider. Take supplements only as told by your health care provider. Follow the directions carefully. Keep all follow-up visits. This is important. Contact a health care provider if: Your symptoms come back. Your symptoms get worse or do not improve with treatment. Get help right away: You develop shortness of breath. You have a rapid heart rate. You have chest pain. You become dizzy or you faint. These symptoms may be an emergency. Get help right away. Call 911. Do not wait to see if the symptoms will go away. Do not drive yourself to the hospital. Summary Vitamin B12 deficiency occurs when the body does not have enough of this important vitamin. Common causes include not eating enough foods that contain vitamin B12, not being able to absorb vitamin B12 from the food that you eat, having a surgery in which part of the stomach or small intestine is removed, or taking certain medicines. Eat foods that have vitamin B12 in them. Treatment may include making a change in the way you eat and drink, getting vitamin B12 injections, or taking vitamin B12 supplements. This information is not intended to replace advice given to you by your health care provider. Make sure you discuss any questions you have with your health care provider. Document Revised: 07/14/2021 Document Reviewed: 07/14/2021 Elsevier Patient Education  2024 Elsevier Inc.  

## 2023-11-25 ENCOUNTER — Inpatient Hospital Stay: Payer: Medicare Other | Attending: Oncology

## 2023-11-25 VITALS — BP 139/87 | HR 79 | Temp 98.1°F | Resp 18 | Ht 63.5 in

## 2023-11-25 DIAGNOSIS — D510 Vitamin B12 deficiency anemia due to intrinsic factor deficiency: Secondary | ICD-10-CM | POA: Insufficient documentation

## 2023-11-25 DIAGNOSIS — E538 Deficiency of other specified B group vitamins: Secondary | ICD-10-CM

## 2023-11-25 MED ORDER — CYANOCOBALAMIN 1000 MCG/ML IJ SOLN
1000.0000 ug | Freq: Once | INTRAMUSCULAR | Status: AC
Start: 2023-11-25 — End: 2023-11-25
  Administered 2023-11-25: 1000 ug via INTRAMUSCULAR
  Filled 2023-11-25: qty 1

## 2023-11-25 NOTE — Patient Instructions (Signed)
 Vitamin B12 Injection What is this medication? Vitamin B12 (VAHY tuh min B12) prevents and treats low vitamin B12 levels in your body. It is used in people who do not get enough vitamin B12 from their diet or when their digestive tract does not absorb enough. Vitamin B12 plays an important role in maintaining the health of your nervous system and red blood cells. This medicine may be used for other purposes; ask your health care provider or pharmacist if you have questions. COMMON BRAND NAME(S): B-12 Compliance Kit, B-12 Injection Kit, Cyomin, Dodex, LA-12, Nutri-Twelve, Physicians EZ Use B-12, Primabalt, Vitamin Deficiency Injectable System - B12 What should I tell my care team before I take this medication? They need to know if you have any of these conditions: Kidney disease Leber's disease Megaloblastic anemia An unusual or allergic reaction to cyanocobalamin, cobalt, other medications, foods, dyes, or preservatives Pregnant or trying to get pregnant Breast-feeding How should I use this medication? This medication is injected into a muscle or deeply under the skin. It is usually given in a clinic or care team's office. However, your care team may teach you how to inject yourself. Follow all instructions. Talk to your care team about the use of this medication in children. Special care may be needed. Overdosage: If you think you have taken too much of this medicine contact a poison control center or emergency room at once. NOTE: This medicine is only for you. Do not share this medicine with others. What if I miss a dose? If you are given your dose at a clinic or care team's office, call to reschedule your appointment. If you give your own injections, and you miss a dose, take it as soon as you can. If it is almost time for your next dose, take only that dose. Do not take double or extra doses. What may interact with this medication? Alcohol Colchicine This list may not describe all possible  interactions. Give your health care provider a list of all the medicines, herbs, non-prescription drugs, or dietary supplements you use. Also tell them if you smoke, drink alcohol, or use illegal drugs. Some items may interact with your medicine. What should I watch for while using this medication? Visit your care team regularly. You may need blood work done while you are taking this medication. You may need to follow a special diet. Talk to your care team. Limit your alcohol intake and avoid smoking to get the best benefit. What side effects may I notice from receiving this medication? Side effects that you should report to your care team as soon as possible: Allergic reactions--skin rash, itching, hives, swelling of the face, lips, tongue, or throat Swelling of the ankles, hands, or feet Trouble breathing Side effects that usually do not require medical attention (report to your care team if they continue or are bothersome): Diarrhea This list may not describe all possible side effects. Call your doctor for medical advice about side effects. You may report side effects to FDA at 1-800-FDA-1088. Where should I keep my medication? Keep out of the reach of children. Store at room temperature between 15 and 30 degrees C (59 and 85 degrees F). Protect from light. Throw away any unused medication after the expiration date. NOTE: This sheet is a summary. It may not cover all possible information. If you have questions about this medicine, talk to your doctor, pharmacist, or health care provider.  2024 Elsevier/Gold Standard (2021-08-01 00:00:00)

## 2023-12-23 ENCOUNTER — Ambulatory Visit: Payer: Medicare Other

## 2023-12-25 ENCOUNTER — Inpatient Hospital Stay: Payer: Medicare Other | Attending: Oncology

## 2023-12-25 VITALS — BP 174/90 | HR 86 | Temp 98.4°F | Resp 18

## 2023-12-25 DIAGNOSIS — D510 Vitamin B12 deficiency anemia due to intrinsic factor deficiency: Secondary | ICD-10-CM | POA: Diagnosis present

## 2023-12-25 DIAGNOSIS — E538 Deficiency of other specified B group vitamins: Secondary | ICD-10-CM

## 2023-12-25 MED ORDER — CYANOCOBALAMIN 1000 MCG/ML IJ SOLN
1000.0000 ug | Freq: Once | INTRAMUSCULAR | Status: AC
  Administered 2023-12-25: 1000 ug via INTRAMUSCULAR
  Filled 2023-12-25: qty 1

## 2023-12-25 NOTE — Patient Instructions (Signed)
Vitamin B12 Injection Quel est ce mdicament ? La vitamine B12 prvient et traite les faibles taux de vitamine B12 dans l'organisme. Elle est utilise [employer] prsentant un dficit en vitamine B12 en raison de Financial risk analyst alimentation ou lorsqu'elle est insuffisamment absorbe par [employer] tube digestif. La vitamine B12 joue un rle important dans le maintien de la sant du systme nerveux et des globules rouges. Ce mdicament peut tre utilis pour d'autres indications ; adressez-vous  votre mdecin ou  votre pharmacien si vous [employer] questions. NOM(S) DE MARQUE COURANT(S) : B-12 Compliance Kit, B-12 Injection Kit, Cyomin, Dodex, LA-12, Nutri-Twelve, Physicians EZ Use B-12, Primabalt, Vitamin Deficiency Injectable System - B12 Que dois-je dire  mon fournisseur de [employer] de prendre ce mdicament ? Ils ont besoin de savoir si vous prsentez l'une quelconque des affections ou situations suivantes : Maladie rnale Maladie de [place] Anmie mgaloblastique Raction inhabituelle ou allergique  la cyanocobalamine ou au cobalt Raction inhabituelle ou allergique  d'autres mdicaments,  des aliments,  des colorants ou  des conservateurs Grossesse ou projet de grossesse Allaitement Comment dois-je utiliser ce mdicament ? Ce mdicament est inject dans un muscle ou en profondeur sous la peau. Il est habituellement administr par [employer] quipe de soins dans un tablissement [employer] ou un Biochemist, clinical. Cependant, votre quipe de soins peut vous apprendre  vous l'injecter vous-mme. Suivez toutes les instructions. Pour toute information relative  l'utilisation de ce mdicament [employer], adressez-vous  votre quipe de [place]. Des prcautions particulires Geophysical data processor. Surdosage : si vous pensez avoir pris ce mdicament en excs, contactez immdiatement un centre [employer].<br />REMARQUE : ce mdicament vous est uniquement destin. Ne partagez pas  ce Arts administrator. Que faire si je saute une dose ? Si votre dose est administre dans [employer] ou un Biochemist, clinical, Corporate investment banker. Si vous vous injectez vous-mme le mdicament et que vous avez saut une dose, injectez-la ds que possible. S'il est presque l'heure de votre prochaine dose, ne prenez que cette dose-l. Ne prenez ni doubles doses, ni doses supplmentaires. Quelles sont les interactions possibles avec ce mdicament ? Alcool Colchicine Cette liste peut ne pas dcrire toutes les interactions mdicamenteuses possibles. Donnez  votre fournisseur de [employer] de tous les mdicaments, plantes mdicinales, mdicaments en vente libre ou complments alimentaires que vous prenez. Dites-lui aussi si vous fumez, buvez de l'alcool ou consommez des drogues illicites. Certaines substances peuvent interagir Writer. Que dois-je IT consultant par ce mdicament ? Consultez rgulirement votre quipe de soins pour un bilan. Vous devrez [employer] de sang rgulires Art therapist par [employer]. Vous devrez ventuellement suivre un rgime alimentaire spcial. Consultez votre quipe de soins. Limitez votre consommation d'alcool et vitez de fumer pour tirer [employer] bnfice de ce mdicament. Quels effets secondaires puis-je remarquer en prenant ce mdicament ? Effets secondaires que vous devez signaler  votre quipe de soins ds que possible : Ractions allergiques : ruption cutane, dmangeaisons, urticaire, gonflement du visage, des lvres, de la langue ou de la gorge Gonflement des Crowder, des mains ou des pieds Difficults  respirer Effets secondaires ne ncessitant gnralement pas un avis mdical (signalez-les  votre quipe de soins s'ils persistent ou sont gnants) : Diarrhe Cette liste peut ne pas dcrire tous les effets secondaires possibles. Appelez votre mdecin pour [employer] sujet des [employer]. Vous pouvez Photographer  la FDA, au [phone]. O dois-je conserver mon mdicament ? Conserver hors de Building surveyor. Conserver  une temprature ambiante comprise entre 15 et 30 C (59 et 45 F). Protger de la lumire. Jeter tout mdicament inutilis aprs la date de premption figurant sur l'tiquette ou sur Art therapist. REMARQUE : cette notice est un rsum. Elle peut ne pas contenir toutes les informations possibles. Si vous avez des questions  propos de ce mdicament, Patent attorney mdecin, votre pharmacien ou votre fournisseur de [place].  2024 Elsevier/Gold Standard (2021-12-11 00:00:00)

## 2024-01-20 ENCOUNTER — Ambulatory Visit: Payer: Medicare Other

## 2024-01-23 ENCOUNTER — Inpatient Hospital Stay: Payer: Medicare Other

## 2024-01-24 ENCOUNTER — Ambulatory Visit: Payer: Medicare Other

## 2024-01-28 ENCOUNTER — Inpatient Hospital Stay: Payer: Medicare Other | Attending: Oncology

## 2024-01-28 VITALS — BP 158/88 | HR 80 | Temp 98.7°F | Resp 20 | Wt 142.0 lb

## 2024-01-28 DIAGNOSIS — D510 Vitamin B12 deficiency anemia due to intrinsic factor deficiency: Secondary | ICD-10-CM | POA: Diagnosis present

## 2024-01-28 DIAGNOSIS — E538 Deficiency of other specified B group vitamins: Secondary | ICD-10-CM

## 2024-01-28 MED ORDER — CYANOCOBALAMIN 1000 MCG/ML IJ SOLN
1000.0000 ug | Freq: Once | INTRAMUSCULAR | Status: AC
  Administered 2024-01-28: 1000 ug via INTRAMUSCULAR
  Filled 2024-01-28: qty 1

## 2024-02-17 ENCOUNTER — Ambulatory Visit: Payer: Medicare Other

## 2024-02-21 ENCOUNTER — Inpatient Hospital Stay: Payer: Medicare Other | Attending: Oncology

## 2024-02-21 VITALS — BP 137/77 | HR 80 | Temp 98.4°F | Resp 20 | Wt 141.0 lb

## 2024-02-21 DIAGNOSIS — Z79899 Other long term (current) drug therapy: Secondary | ICD-10-CM | POA: Insufficient documentation

## 2024-02-21 DIAGNOSIS — E538 Deficiency of other specified B group vitamins: Secondary | ICD-10-CM

## 2024-02-21 DIAGNOSIS — D510 Vitamin B12 deficiency anemia due to intrinsic factor deficiency: Secondary | ICD-10-CM | POA: Diagnosis present

## 2024-02-21 MED ORDER — CYANOCOBALAMIN 1000 MCG/ML IJ SOLN
1000.0000 ug | Freq: Once | INTRAMUSCULAR | Status: AC
Start: 2024-02-21 — End: 2024-02-21
  Administered 2024-02-21: 1000 ug via INTRAMUSCULAR
  Filled 2024-02-21: qty 1

## 2024-03-16 ENCOUNTER — Ambulatory Visit: Payer: Medicare Other

## 2024-03-24 ENCOUNTER — Inpatient Hospital Stay: Payer: Medicare Other | Attending: Oncology

## 2024-03-24 VITALS — BP 160/98 | HR 84 | Temp 98.6°F | Resp 18 | Ht 63.5 in | Wt 142.8 lb

## 2024-03-24 DIAGNOSIS — D510 Vitamin B12 deficiency anemia due to intrinsic factor deficiency: Secondary | ICD-10-CM | POA: Insufficient documentation

## 2024-03-24 DIAGNOSIS — E538 Deficiency of other specified B group vitamins: Secondary | ICD-10-CM

## 2024-03-24 MED ORDER — CYANOCOBALAMIN 1000 MCG/ML IJ SOLN
1000.0000 ug | Freq: Once | INTRAMUSCULAR | Status: AC
  Administered 2024-03-24: 1000 ug via INTRAMUSCULAR
  Filled 2024-03-24: qty 1

## 2024-04-13 ENCOUNTER — Ambulatory Visit: Payer: Medicare Other

## 2024-04-23 ENCOUNTER — Inpatient Hospital Stay: Payer: Medicare Other | Attending: Oncology

## 2024-04-23 VITALS — BP 142/81 | HR 78 | Temp 98.0°F

## 2024-04-23 DIAGNOSIS — E538 Deficiency of other specified B group vitamins: Secondary | ICD-10-CM

## 2024-04-23 DIAGNOSIS — D510 Vitamin B12 deficiency anemia due to intrinsic factor deficiency: Secondary | ICD-10-CM | POA: Insufficient documentation

## 2024-04-23 MED ORDER — CYANOCOBALAMIN 1000 MCG/ML IJ SOLN
1000.0000 ug | Freq: Once | INTRAMUSCULAR | Status: AC
  Administered 2024-04-23: 1000 ug via INTRAMUSCULAR
  Filled 2024-04-23: qty 1

## 2024-04-23 NOTE — Patient Instructions (Signed)
 Vitamin B12 Injection What is this medication? Vitamin B12 (VAHY tuh min B12) prevents and treats low vitamin B12 levels in your body. It is used in people who do not get enough vitamin B12 from their diet or when their digestive tract does not absorb enough. Vitamin B12 plays an important role in maintaining the health of your nervous system and red blood cells. This medicine may be used for other purposes; ask your health care provider or pharmacist if you have questions. COMMON BRAND NAME(S): B-12 Compliance Kit, B-12 Injection Kit, Cyomin, Dodex, LA-12, Nutri-Twelve, Physicians EZ Use B-12, Primabalt, Vitamin Deficiency Injectable System - B12 What should I tell my care team before I take this medication? They need to know if you have any of these conditions: Kidney disease Leber's disease Megaloblastic anemia An unusual or allergic reaction to cyanocobalamin, cobalt, other medications, foods, dyes, or preservatives Pregnant or trying to get pregnant Breast-feeding How should I use this medication? This medication is injected into a muscle or deeply under the skin. It is usually given in a clinic or care team's office. However, your care team may teach you how to inject yourself. Follow all instructions. Talk to your care team about the use of this medication in children. Special care may be needed. Overdosage: If you think you have taken too much of this medicine contact a poison control center or emergency room at once. NOTE: This medicine is only for you. Do not share this medicine with others. What if I miss a dose? If you are given your dose at a clinic or care team's office, call to reschedule your appointment. If you give your own injections, and you miss a dose, take it as soon as you can. If it is almost time for your next dose, take only that dose. Do not take double or extra doses. What may interact with this medication? Alcohol Colchicine This list may not describe all possible  interactions. Give your health care provider a list of all the medicines, herbs, non-prescription drugs, or dietary supplements you use. Also tell them if you smoke, drink alcohol, or use illegal drugs. Some items may interact with your medicine. What should I watch for while using this medication? Visit your care team regularly. You may need blood work done while you are taking this medication. You may need to follow a special diet. Talk to your care team. Limit your alcohol intake and avoid smoking to get the best benefit. What side effects may I notice from receiving this medication? Side effects that you should report to your care team as soon as possible: Allergic reactions--skin rash, itching, hives, swelling of the face, lips, tongue, or throat Swelling of the ankles, hands, or feet Trouble breathing Side effects that usually do not require medical attention (report to your care team if they continue or are bothersome): Diarrhea This list may not describe all possible side effects. Call your doctor for medical advice about side effects. You may report side effects to FDA at 1-800-FDA-1088. Where should I keep my medication? Keep out of the reach of children. Store at room temperature between 15 and 30 degrees C (59 and 85 degrees F). Protect from light. Throw away any unused medication after the expiration date. NOTE: This sheet is a summary. It may not cover all possible information. If you have questions about this medicine, talk to your doctor, pharmacist, or health care provider.  2024 Elsevier/Gold Standard (2021-08-01 00:00:00)

## 2024-05-11 ENCOUNTER — Ambulatory Visit: Payer: Medicare Other

## 2024-05-25 ENCOUNTER — Inpatient Hospital Stay: Payer: Medicare Other | Attending: Oncology

## 2024-05-25 VITALS — BP 143/84 | HR 72 | Temp 98.1°F | Resp 18 | Ht 63.5 in

## 2024-05-25 DIAGNOSIS — D510 Vitamin B12 deficiency anemia due to intrinsic factor deficiency: Secondary | ICD-10-CM | POA: Diagnosis present

## 2024-05-25 DIAGNOSIS — E538 Deficiency of other specified B group vitamins: Secondary | ICD-10-CM

## 2024-05-25 MED ORDER — CYANOCOBALAMIN 1000 MCG/ML IJ SOLN
1000.0000 ug | Freq: Once | INTRAMUSCULAR | Status: AC
  Administered 2024-05-25: 1000 ug via INTRAMUSCULAR
  Filled 2024-05-25: qty 1

## 2024-05-25 NOTE — Patient Instructions (Signed)
 Vitamin B12 Injection What is this medication? Vitamin B12 (VAHY tuh min B12) prevents and treats low vitamin B12 levels in your body. It is used in people who do not get enough vitamin B12 from their diet or when their digestive tract does not absorb enough. Vitamin B12 plays an important role in maintaining the health of your nervous system and red blood cells. This medicine may be used for other purposes; ask your health care provider or pharmacist if you have questions. COMMON BRAND NAME(S): B-12 Compliance Kit, B-12 Injection Kit, Cyomin, Dodex, LA-12, Nutri-Twelve, Physicians EZ Use B-12, Primabalt, Vitamin Deficiency Injectable System - B12 What should I tell my care team before I take this medication? They need to know if you have any of these conditions: Kidney disease Leber's disease Megaloblastic anemia An unusual or allergic reaction to cyanocobalamin, cobalt, other medications, foods, dyes, or preservatives Pregnant or trying to get pregnant Breast-feeding How should I use this medication? This medication is injected into a muscle or deeply under the skin. It is usually given in a clinic or care team's office. However, your care team may teach you how to inject yourself. Follow all instructions. Talk to your care team about the use of this medication in children. Special care may be needed. Overdosage: If you think you have taken too much of this medicine contact a poison control center or emergency room at once. NOTE: This medicine is only for you. Do not share this medicine with others. What if I miss a dose? If you are given your dose at a clinic or care team's office, call to reschedule your appointment. If you give your own injections, and you miss a dose, take it as soon as you can. If it is almost time for your next dose, take only that dose. Do not take double or extra doses. What may interact with this medication? Alcohol Colchicine This list may not describe all possible  interactions. Give your health care provider a list of all the medicines, herbs, non-prescription drugs, or dietary supplements you use. Also tell them if you smoke, drink alcohol, or use illegal drugs. Some items may interact with your medicine. What should I watch for while using this medication? Visit your care team regularly. You may need blood work done while you are taking this medication. You may need to follow a special diet. Talk to your care team. Limit your alcohol intake and avoid smoking to get the best benefit. What side effects may I notice from receiving this medication? Side effects that you should report to your care team as soon as possible: Allergic reactions--skin rash, itching, hives, swelling of the face, lips, tongue, or throat Swelling of the ankles, hands, or feet Trouble breathing Side effects that usually do not require medical attention (report to your care team if they continue or are bothersome): Diarrhea This list may not describe all possible side effects. Call your doctor for medical advice about side effects. You may report side effects to FDA at 1-800-FDA-1088. Where should I keep my medication? Keep out of the reach of children. Store at room temperature between 15 and 30 degrees C (59 and 85 degrees F). Protect from light. Throw away any unused medication after the expiration date. NOTE: This sheet is a summary. It may not cover all possible information. If you have questions about this medicine, talk to your doctor, pharmacist, or health care provider.  2024 Elsevier/Gold Standard (2021-08-01 00:00:00)

## 2024-06-12 NOTE — Progress Notes (Signed)
 [facility]  [address] [place],  [place]  [address] [phone]  Clinic Day: 06/19/2024  Referring physician: Illath, Jaseela, MD  CHIEF COMPLAINT:  CC:   Pernicious anemia  Current Treatment:   Monthly B12 injections  HISTORY OF PRESENT ILLNESS:  Ann Curtis is a 78 y.o. female with a history of pernicious anemia diagnosed in April 2008.  This was so severe, it was almost mistaken for leukemia.  She had pancytopenia and was found to have an extremely low B12 level. Her pancytopenia resolved with B12 repletion.  She continues monthly B12 injections.  As she has a history of ovarian cancer, as well as a family history of ovarian and breast cancer, she underwent genetic testing for BRCA 1 and 2, at another institution in 2017, which did not reveal any clinically significant mutation.  When she was seen in May for routine follow-up, she had mild microcytic anemia and was found to be iron deficient.  She did not have any obvious source of blood loss.  Stool hemoccults x3 were negative. Urinalysis did not reveal any occult blood.  She had a colonoscopy in February with removal of a tubular adenoma.  I suggestions she see Dr. Kalman, her gastroenterologist, for consideration of EGD.  I recommended she receive IV iron replacement in the form of Feraheme , but she wanted to discuss this with her primary care provider, Dr. Novella. She proceeded with IV Feraheme  in June.  She has continued monthly B12 injections.  INTERVAL HISTORY:  [name] is here today for repeat clinical assessment for her Pernicious anemia. Patient states that she feels well and has no complaints of pain. She states that her last colonoscopy was done about 3 years ago and she did not need to schedule a follow-up. She continues Eliquis 5 mg BID without difficulty. She has a WBC of 9.5, hemoglobin of 13.5, platelet count of 215,000, and a normal CMP. She will receive her monthly B-12 injection today. I will see her  back in 1 year with CBC and CMP.  She denies fever, chills, night sweats, or other signs of infection. She denies cardiorespiratory and gastrointestinal issues. She  denies pain. Her appetite is good and Her weight has decreased 3 pounds over last 4 months.   REVIEW OF SYSTEMS:  Review of Systems  Constitutional: Negative.  Negative for appetite change, chills, diaphoresis, fatigue, fever and unexpected weight change.  HENT:  Negative.  Negative for hearing loss, lump/mass, mouth sores, nosebleeds, sore throat, tinnitus, trouble swallowing and voice change.   Eyes: Negative.  Negative for eye problems and icterus.  Respiratory: Negative.  Negative for chest tightness, cough, hemoptysis, shortness of breath and wheezing.   Cardiovascular: Negative.  Negative for chest pain, leg swelling and palpitations.  Gastrointestinal: Negative.  Negative for abdominal distention, abdominal pain, blood in stool, constipation, diarrhea, nausea, rectal pain and vomiting.  Endocrine: Negative.  Negative for hot flashes.  Genitourinary: Negative.  Negative for bladder incontinence, difficulty urinating, dyspareunia, dysuria, frequency, hematuria, menstrual problem, nocturia, pelvic pain, vaginal bleeding and vaginal discharge.   Musculoskeletal:  Negative for arthralgias, back pain, flank pain, gait problem, myalgias, neck pain and neck stiffness.  Skin: Negative.  Negative for itching, rash and wound.  Neurological:  Negative for dizziness, extremity weakness, gait problem, headaches, light-headedness, numbness, seizures and speech difficulty.  Hematological: Negative.  Negative for adenopathy. Does not bruise/bleed easily.  Psychiatric/Behavioral: Negative.  Negative for confusion, decreased concentration, depression, sleep disturbance and suicidal ideas. The patient  is not nervous/anxious.     VITALS:  Blood pressure 139/88, pulse 70, temperature 98 F (36.7 C), temperature source Oral, resp. rate 18, height  5' 3.5 (1.613 m), weight 138 lb 11.2 oz (62.9 kg), SpO2 100%.  Wt Readings from Last 3 Encounters:  06/19/24 138 lb 11.2 oz (62.9 kg)  03/24/24 142 lb 12.8 oz (64.8 kg)  02/21/24 141 lb (64 kg)    Body mass index is 24.18 kg/m.  Performance status (ECOG): 0 - Asymptomatic  PHYSICAL EXAM:  Physical Exam Vitals and nursing note reviewed.  Constitutional:      General: She is not in acute distress.    Appearance: Normal appearance. She is normal weight. She is not ill-appearing, toxic-appearing or diaphoretic.  HENT:     Head: Normocephalic and atraumatic.     Right Ear: Tympanic membrane, ear canal and external ear normal. There is no impacted cerumen.     Left Ear: Tympanic membrane, ear canal and external ear normal. There is no impacted cerumen.     Nose: Nose normal. No congestion or rhinorrhea.     Mouth/Throat:     Mouth: Mucous membranes are moist.     Pharynx: Oropharynx is clear. No oropharyngeal exudate or posterior oropharyngeal erythema.  Eyes:     General: No scleral icterus.       Right eye: No discharge.        Left eye: No discharge.     Extraocular Movements: Extraocular movements intact.     Conjunctiva/sclera: Conjunctivae normal.     Pupils: Pupils are equal, round, and reactive to light.  Neck:     Vascular: No carotid bruit.  Cardiovascular:     Rate and Rhythm: Normal rate and regular rhythm.     Pulses: Normal pulses.     Heart sounds: Normal heart sounds. No murmur heard.    No friction rub. No gallop.  Pulmonary:     Effort: Pulmonary effort is normal. No respiratory distress.     Breath sounds: Normal breath sounds. No stridor. No wheezing, rhonchi or rales.  Chest:     Chest wall: No tenderness.  Abdominal:     General: Bowel sounds are normal. There is no distension.     Palpations: Abdomen is soft. There is no hepatomegaly, splenomegaly or mass.     Tenderness: There is no abdominal tenderness. There is no right CVA tenderness, left CVA  tenderness, guarding or rebound.     Hernia: No hernia is present.  Musculoskeletal:        General: No swelling, tenderness, deformity or signs of injury. Normal range of motion.     Cervical back: Normal range of motion and neck supple. No rigidity or tenderness.     Right lower leg: No edema.     Left lower leg: No edema.  Lymphadenopathy:     Cervical: No cervical adenopathy.     Right cervical: No superficial, deep or posterior cervical adenopathy.    Left cervical: No superficial, deep or posterior cervical adenopathy.     Upper Body:     Right upper body: No supraclavicular, axillary or pectoral adenopathy.     Left upper body: No supraclavicular, axillary or pectoral adenopathy.     Lower Body: No right inguinal adenopathy. No left inguinal adenopathy.  Skin:    General: Skin is warm and dry.     Coloration: Skin is not jaundiced or pale.     Findings: No bruising, erythema, lesion or rash.  Neurological:     General: No focal deficit present.     Mental Status: She is alert and oriented to person, place, and time. Mental status is at baseline.     Cranial Nerves: No cranial nerve deficit.     Sensory: No sensory deficit.     Motor: No weakness.     Coordination: Coordination normal.     Gait: Gait normal.     Deep Tendon Reflexes: Reflexes normal.  Psychiatric:        Mood and Affect: Mood normal.        Behavior: Behavior normal.        Thought Content: Thought content normal.        Judgment: Judgment normal.    LABS:      Latest Ref Rng & Units 06/19/2024    9:20 AM 06/20/2023    9:37 AM 06/20/2022   12:00 AM  CBC  WBC 4.0 - 10.5 K/uL 9.5  12.1  9.7      Hemoglobin 12.0 - 15.0 g/dL 86.4  85.9  85.9      Hematocrit 36.0 - 46.0 % 39.8  42.2  42      Platelets 150 - 400 K/uL 215  238  212         This result is from an external source.      Latest Ref Rng & Units 06/19/2024    9:20 AM 06/20/2023    9:37 AM 06/20/2022   12:00 AM  CMP  Glucose 70 - 99 mg/dL 88   891    BUN 8 - 23 mg/dL 15  13  14       Creatinine 0.44 - 1.00 mg/dL 9.09  9.31  0.6      Sodium 135 - 145 mmol/L 139  136  140      Potassium 3.5 - 5.1 mmol/L 3.9  3.4  4.1      Chloride 98 - 111 mmol/L 102  102  104      CO2 22 - 32 mmol/L 25  23  25       Calcium 8.9 - 10.3 mg/dL 89.7  9.4  9.8      Total Protein 6.5 - 8.1 g/dL 7.9  8.4    Total Bilirubin 0.0 - 1.2 mg/dL 1.1  1.2    Alkaline Phos 38 - 126 U/L 114  130  115      AST 15 - 41 U/L 33  37  39      ALT 0 - 44 U/L 10  18  19          This result is from an external source.   Component Ref Range & Units 01/16/2024  Sodium 135 - 145 mmol/L 139  Potassium 3.5 - 5.0 mmol/L 3.8  Chloride 98 - 108 mmol/L 104  Carbon Dioxide (CO2) 21 - 30 mmol/L 22  Urea Nitrogen (BUN) 7 - 20 mg/dL 12  Creatinine 0.4 - 1.0 mg/dL 0.8  Glucose 70 - 859 mg/dL 894  Calcium 8.7 - 89.7 mg/dL 89.8  AST (Aspartate Aminotransferase) 15 - 41 U/L 33  ALT (Alanine Aminotransferase) 10 - 39 U/L 14  Bilirubin, Total 0.4 - 1.5 mg/dL 1.3  Alk Phos (Alkaline Phosphatase) 24 - 110 U/L 99  Albumin 3.5 - 4.8 g/dL 4.6  Protein, Total 6.2 - 8.1 g/dL 8.2 High   Anion Gap 3 - 12 mmol/L 13 High   BUN/CREA Ratio 6 - 27 15   Component Ref Range &  Units 01/16/2024  Thyroid Stimulating Hormone (TSH) 0.34 - 5.66 IU/mL 4.64  Thyroxine, Free (FT4) 0.52 - 1.21 ng/dL 9.16   No results found for: CEA1, CEA / No results found for: CEA1, CEA No results found for: PSA1 No results found for: CAN199 No results found for: CAN125  No results found for: STEPHANY RINGS, A1GS, A2GS, BETS, BETA2SER, GAMS, MSPIKE, SPEI Lab Results  Component Value Date   TIBC 400 06/20/2023   TIBC 412 06/20/2022   TIBC 432 06/15/2021   FERRITIN 152 06/20/2023   FERRITIN 136 06/20/2022   FERRITIN 356 (H) 06/15/2021   IRONPCTSAT 9 (L) 06/20/2023   IRONPCTSAT 21 06/20/2022   IRONPCTSAT 41 (H) 06/15/2021   No results found for:  LDH  STUDIES:  No results found.     HISTORY:   Past Medical History:  Diagnosis Date   Pernicious anemia    Pernicious anemia     No past surgical history on file.  No family history on file.  Social History:  reports that she has never smoked. She has never used smokeless tobacco. She reports that she does not currently use alcohol. She reports that she does not use drugs.The patient is alone today.  Allergies:  Allergies  Allergen Reactions   Codeine Nausea Only    UNKNOWN    Current Medications: Current Outpatient Medications  Medication Sig Dispense Refill   apixaban (ELIQUIS) 5 MG TABS tablet Take 5 mg by mouth 2 (two) times daily.     Cholecalciferol 25 MCG (1000 UT) tablet Take by mouth.     cyanocobalamin  (,VITAMIN B-12,) 1000 MCG/ML injection Inject into the muscle.     loratadine (CLARITIN) 10 MG tablet Take by mouth.     metoprolol succinate (TOPROL-XL) 25 MG 24 hr tablet Take 25 mg by mouth daily.     simvastatin (ZOCOR) 20 MG tablet Take 20 mg by mouth at bedtime.     simvastatin (ZOCOR) 20 MG tablet Take 1 tablet by mouth at bedtime.     sodium chloride  (OCEAN) 0.65 % SOLN nasal spray Place 1 spray into both nostrils as needed for congestion. (Patient not taking: Reported on 06/19/2024)     VITAMIN B1-B12 IM Inject 1,000 mcg into the muscle every 30 (thirty) days.     YUVAFEM 10 MCG TABS vaginal tablet Place vaginally.     No current facility-administered medications for this visit.     ASSESSMENT & PLAN:  Assessment:  1. Pernicious anemia, she will continue B12 injections monthly.  2.  Iron deficiency anemia diagnosed in May, 2022.  This has resolved with IV iron replacement.  Plan:    She states that her last colonoscopy was done about 3 years ago and she did not need to schedule a follow-up. She continues Eliquis 5 mg BID without difficulty. She has a WBC of 9.5, hemoglobin of 13.5, platelet count of 215,000, and a normal CMP. She will receive  her monthly B-12 injection today. I will see her back in 1 year with CBC and CMP. The patient understands the plans discussed today and is in agreement with them. She knows to contact our office if there is any decrease in her hemoglobin or she has other concerns prior to her next appointment.  I provided 11 minutes of face-to-face time during this encounter and > 50% was spent counseling as documented under my assessment and plan.   Wanda VEAR Cornish, MD  [facility] [place] - A DEPT OF  [facility] [address] [place] [address] Dept: [phone] Dept Fax: [phone]   No orders of the defined types were placed in this encounter.   I,Jasmine M Lassiter,acting as a scribe for Wanda VEAR Cornish, MD.,have documented all relevant documentation on the behalf of Wanda VEAR Cornish, MD,as directed by  Wanda VEAR Cornish, MD while in the presence of Wanda VEAR Cornish, MD.  Wanda VEAR Cornish, MD

## 2024-06-19 ENCOUNTER — Inpatient Hospital Stay: Payer: Medicare Other

## 2024-06-19 ENCOUNTER — Encounter: Payer: Self-pay | Admitting: Oncology

## 2024-06-19 ENCOUNTER — Other Ambulatory Visit: Payer: Self-pay | Admitting: Oncology

## 2024-06-19 ENCOUNTER — Inpatient Hospital Stay

## 2024-06-19 ENCOUNTER — Inpatient Hospital Stay: Payer: Medicare Other | Attending: Oncology | Admitting: Oncology

## 2024-06-19 VITALS — BP 139/88 | HR 70 | Temp 98.0°F | Resp 18 | Ht 63.5 in | Wt 138.7 lb

## 2024-06-19 DIAGNOSIS — Z79899 Other long term (current) drug therapy: Secondary | ICD-10-CM | POA: Insufficient documentation

## 2024-06-19 DIAGNOSIS — E538 Deficiency of other specified B group vitamins: Secondary | ICD-10-CM

## 2024-06-19 DIAGNOSIS — D509 Iron deficiency anemia, unspecified: Secondary | ICD-10-CM

## 2024-06-19 DIAGNOSIS — D510 Vitamin B12 deficiency anemia due to intrinsic factor deficiency: Secondary | ICD-10-CM | POA: Insufficient documentation

## 2024-06-19 LAB — CBC WITH DIFFERENTIAL (CANCER CENTER ONLY)
Abs Immature Granulocytes: 0.02 K/uL (ref 0.00–0.07)
Basophils Absolute: 0.1 K/uL (ref 0.0–0.1)
Basophils Relative: 1 %
Eosinophils Absolute: 0.1 K/uL (ref 0.0–0.5)
Eosinophils Relative: 2 %
HCT: 39.8 % (ref 36.0–46.0)
Hemoglobin: 13.5 g/dL (ref 12.0–15.0)
Immature Granulocytes: 0 %
Lymphocytes Relative: 22 %
Lymphs Abs: 2.1 K/uL (ref 0.7–4.0)
MCH: 30.9 pg (ref 26.0–34.0)
MCHC: 33.9 g/dL (ref 30.0–36.0)
MCV: 91.1 fL (ref 80.0–100.0)
Monocytes Absolute: 0.8 K/uL (ref 0.1–1.0)
Monocytes Relative: 9 %
Neutro Abs: 6.4 K/uL (ref 1.7–7.7)
Neutrophils Relative %: 66 %
Platelet Count: 215 K/uL (ref 150–400)
RBC: 4.37 MIL/uL (ref 3.87–5.11)
RDW: 12.7 % (ref 11.5–15.5)
WBC Count: 9.5 K/uL (ref 4.0–10.5)
nRBC: 0 % (ref 0.0–0.2)

## 2024-06-19 LAB — CMP (CANCER CENTER ONLY)
ALT: 10 U/L (ref 0–44)
AST: 33 U/L (ref 15–41)
Albumin: 4.3 g/dL (ref 3.5–5.0)
Alkaline Phosphatase: 114 U/L (ref 38–126)
Anion gap: 12 (ref 5–15)
BUN: 15 mg/dL (ref 8–23)
CO2: 25 mmol/L (ref 22–32)
Calcium: 10.2 mg/dL (ref 8.9–10.3)
Chloride: 102 mmol/L (ref 98–111)
Creatinine: 0.9 mg/dL (ref 0.44–1.00)
GFR, Estimated: >60 mL/min (ref >60)
Glucose, Bld: 88 mg/dL (ref 70–99)
Potassium: 3.9 mmol/L (ref 3.5–5.1)
Sodium: 139 mmol/L (ref 135–145)
Total Bilirubin: 1.1 mg/dL (ref 0.0–1.2)
Total Protein: 7.9 g/dL (ref 6.5–8.1)

## 2024-06-19 MED ORDER — CYANOCOBALAMIN 1000 MCG/ML IJ SOLN
1000.0000 ug | Freq: Once | INTRAMUSCULAR | Status: AC
  Administered 2024-06-19: 1000 ug via INTRAMUSCULAR
  Filled 2024-06-19: qty 1

## 2024-06-19 NOTE — Patient Instructions (Signed)
 Vitamin B12 Injection What is this medication? Vitamin B12 (VAHY tuh min B12) prevents and treats low vitamin B12 levels in your body. It is used in people who do not get enough vitamin B12 from their diet or when their digestive tract does not absorb enough. Vitamin B12 plays an important role in maintaining the health of your nervous system and red blood cells. This medicine may be used for other purposes; ask your health care provider or pharmacist if you have questions. COMMON BRAND NAME(S): B-12 Compliance Kit, B-12 Injection Kit, Cyomin, Dodex, LA-12, Nutri-Twelve, Physicians EZ Use B-12, Primabalt, Vitamin Deficiency Injectable System - B12 What should I tell my care team before I take this medication? They need to know if you have any of these conditions: Kidney disease Leber's disease Megaloblastic anemia An unusual or allergic reaction to cyanocobalamin, cobalt, other medications, foods, dyes, or preservatives Pregnant or trying to get pregnant Breast-feeding How should I use this medication? This medication is injected into a muscle or deeply under the skin. It is usually given in a clinic or care team's office. However, your care team may teach you how to inject yourself. Follow all instructions. Talk to your care team about the use of this medication in children. Special care may be needed. Overdosage: If you think you have taken too much of this medicine contact a poison control center or emergency room at once. NOTE: This medicine is only for you. Do not share this medicine with others. What if I miss a dose? If you are given your dose at a clinic or care team's office, call to reschedule your appointment. If you give your own injections, and you miss a dose, take it as soon as you can. If it is almost time for your next dose, take only that dose. Do not take double or extra doses. What may interact with this medication? Alcohol Colchicine This list may not describe all possible  interactions. Give your health care provider a list of all the medicines, herbs, non-prescription drugs, or dietary supplements you use. Also tell them if you smoke, drink alcohol, or use illegal drugs. Some items may interact with your medicine. What should I watch for while using this medication? Visit your care team regularly. You may need blood work done while you are taking this medication. You may need to follow a special diet. Talk to your care team. Limit your alcohol intake and avoid smoking to get the best benefit. What side effects may I notice from receiving this medication? Side effects that you should report to your care team as soon as possible: Allergic reactions--skin rash, itching, hives, swelling of the face, lips, tongue, or throat Swelling of the ankles, hands, or feet Trouble breathing Side effects that usually do not require medical attention (report to your care team if they continue or are bothersome): Diarrhea This list may not describe all possible side effects. Call your doctor for medical advice about side effects. You may report side effects to FDA at 1-800-FDA-1088. Where should I keep my medication? Keep out of the reach of children. Store at room temperature between 15 and 30 degrees C (59 and 85 degrees F). Protect from light. Throw away any unused medication after the expiration date. NOTE: This sheet is a summary. It may not cover all possible information. If you have questions about this medicine, talk to your doctor, pharmacist, or health care provider.  2024 Elsevier/Gold Standard (2021-08-01 00:00:00)

## 2024-06-23 ENCOUNTER — Ambulatory Visit: Payer: Medicare Other

## 2024-07-02 ENCOUNTER — Encounter: Payer: Self-pay | Admitting: Oncology

## 2024-07-17 ENCOUNTER — Ambulatory Visit

## 2024-07-22 ENCOUNTER — Inpatient Hospital Stay: Attending: Oncology

## 2024-07-22 VITALS — BP 155/89 | HR 68 | Temp 98.4°F | Resp 18 | Ht 63.5 in

## 2024-07-22 DIAGNOSIS — D510 Vitamin B12 deficiency anemia due to intrinsic factor deficiency: Secondary | ICD-10-CM | POA: Diagnosis present

## 2024-07-22 DIAGNOSIS — E538 Deficiency of other specified B group vitamins: Secondary | ICD-10-CM

## 2024-07-22 MED ORDER — CYANOCOBALAMIN 1000 MCG/ML IJ SOLN
1000.0000 ug | Freq: Once | INTRAMUSCULAR | Status: AC
  Administered 2024-07-22: 1000 ug via INTRAMUSCULAR
  Filled 2024-07-22: qty 1

## 2024-07-22 NOTE — Patient Instructions (Signed)
 Vitamin B12 Injection What is this medication? Vitamin B12 (VAHY tuh min B12) prevents and treats low vitamin B12 levels in your body. It is used in people who do not get enough vitamin B12 from their diet or when their digestive tract does not absorb enough. Vitamin B12 plays an important role in maintaining the health of your nervous system and red blood cells. This medicine may be used for other purposes; ask your health care provider or pharmacist if you have questions. COMMON BRAND NAME(S): B-12 Compliance Kit, B-12 Injection Kit, Cyomin, Dodex , LA-12, Nutri-Twelve, Physicians EZ Use B-12, Primabalt, Vitamin Deficiency Injectable System - B12 What should I tell my care team before I take this medication? They need to know if you have any of these conditions: Kidney disease Leber's disease Megaloblastic anemia An unusual or allergic reaction to cyanocobalamin , cobalt, other medications, foods, dyes, or preservatives Pregnant or trying to get pregnant Breast-feeding How should I use this medication? This medication is injected into a muscle or deeply under the skin. It is usually given in a clinic or care team's office. However, your care team may teach you how to inject yourself. Follow all instructions. Talk to your care team about the use of this medication in children. Special care may be needed. Overdosage: If you think you have taken too much of this medicine contact a poison control center or emergency room at once. NOTE: This medicine is only for you. Do not share this medicine with others. What if I miss a dose? If you are given your dose at a clinic or care team's office, call to reschedule your appointment. If you give your own injections, and you miss a dose, take it as soon as you can. If it is almost time for your next dose, take only that dose. Do not take double or extra doses. What may interact with this medication? Alcohol Colchicine This list may not describe all possible  interactions. Give your health care provider a list of all the medicines, herbs, non-prescription drugs, or dietary supplements you use. Also tell them if you smoke, drink alcohol, or use illegal drugs. Some items may interact with your medicine. What should I watch for while using this medication? Visit your care team regularly. You may need blood work done while you are taking this medication. You may need to follow a special diet. Talk to your care team. Limit your alcohol intake and avoid smoking to get the best benefit. What side effects may I notice from receiving this medication? Side effects that you should report to your care team as soon as possible: Allergic reactions--skin rash, itching, hives, swelling of the face, lips, tongue, or throat Swelling of the ankles, hands, or feet Trouble breathing Side effects that usually do not require medical attention (report to your care team if they continue or are bothersome): Diarrhea This list may not describe all possible side effects. Call your doctor for medical advice about side effects. You may report side effects to FDA at 1-800-FDA-1088. Where should I keep my medication? Keep out of the reach of children. Store at room temperature between 15 and 30 degrees C (59 and 85 degrees F). Protect from light. Throw away any unused medication after the expiration date. NOTE: This sheet is a summary. It may not cover all possible information. If you have questions about this medicine, talk to your doctor, pharmacist, or health care provider.  2024 Elsevier/Gold Standard (2021-08-01 00:00:00)

## 2024-08-14 ENCOUNTER — Ambulatory Visit

## 2024-08-21 ENCOUNTER — Inpatient Hospital Stay: Attending: Oncology

## 2024-08-21 VITALS — BP 141/88 | HR 75 | Temp 98.0°F | Resp 18

## 2024-08-21 DIAGNOSIS — D510 Vitamin B12 deficiency anemia due to intrinsic factor deficiency: Secondary | ICD-10-CM | POA: Diagnosis present

## 2024-08-21 DIAGNOSIS — E538 Deficiency of other specified B group vitamins: Secondary | ICD-10-CM

## 2024-08-21 MED ORDER — CYANOCOBALAMIN 1000 MCG/ML IJ SOLN
1000.0000 ug | Freq: Once | INTRAMUSCULAR | Status: AC
  Administered 2024-08-21: 1000 ug via INTRAMUSCULAR
  Filled 2024-08-21: qty 1

## 2024-09-11 ENCOUNTER — Ambulatory Visit

## 2024-09-21 ENCOUNTER — Inpatient Hospital Stay: Attending: Oncology

## 2024-09-21 VITALS — BP 152/91 | HR 72 | Temp 98.2°F | Resp 18

## 2024-09-21 DIAGNOSIS — E538 Deficiency of other specified B group vitamins: Secondary | ICD-10-CM

## 2024-09-21 DIAGNOSIS — D510 Vitamin B12 deficiency anemia due to intrinsic factor deficiency: Secondary | ICD-10-CM | POA: Insufficient documentation

## 2024-09-21 MED ORDER — CYANOCOBALAMIN 1000 MCG/ML IJ SOLN
1000.0000 ug | Freq: Once | INTRAMUSCULAR | Status: AC
  Administered 2024-09-21: 1000 ug via INTRAMUSCULAR
  Filled 2024-09-21: qty 1

## 2024-09-21 NOTE — Patient Instructions (Signed)
 Vitamin B12 Injection What is this medication? Vitamin B12 (VAHY tuh min B12) prevents and treats low vitamin B12 levels in your body. It is used in people who do not get enough vitamin B12 from their diet or when their digestive tract does not absorb enough. Vitamin B12 plays an important role in maintaining the health of your nervous system and red blood cells. This medicine may be used for other purposes; ask your health care provider or pharmacist if you have questions. COMMON BRAND NAME(S): B-12 Compliance Kit, B-12 Injection Kit, Cyomin, Dodex , LA-12, Nutri-Twelve, Physicians EZ Use B-12, Primabalt, Vitamin Deficiency Injectable System - B12 What should I tell my care team before I take this medication? They need to know if you have any of these conditions: Kidney disease Leber's disease Megaloblastic anemia An unusual or allergic reaction to cyanocobalamin , cobalt, other medications, foods, dyes, or preservatives Pregnant or trying to get pregnant Breast-feeding How should I use this medication? This medication is injected into a muscle or deeply under the skin. It is usually given in a clinic or care team's office. However, your care team may teach you how to inject yourself. Follow all instructions. Talk to your care team about the use of this medication in children. Special care may be needed. Overdosage: If you think you have taken too much of this medicine contact a poison control center or emergency room at once. NOTE: This medicine is only for you. Do not share this medicine with others. What if I miss a dose? If you are given your dose at a clinic or care team's office, call to reschedule your appointment. If you give your own injections, and you miss a dose, take it as soon as you can. If it is almost time for your next dose, take only that dose. Do not take double or extra doses. What may interact with this medication? Alcohol Colchicine This list may not describe all possible  interactions. Give your health care provider a list of all the medicines, herbs, non-prescription drugs, or dietary supplements you use. Also tell them if you smoke, drink alcohol, or use illegal drugs. Some items may interact with your medicine. What should I watch for while using this medication? Visit your care team regularly. You may need blood work done while you are taking this medication. You may need to follow a special diet. Talk to your care team. Limit your alcohol intake and avoid smoking to get the best benefit. What side effects may I notice from receiving this medication? Side effects that you should report to your care team as soon as possible: Allergic reactions--skin rash, itching, hives, swelling of the face, lips, tongue, or throat Swelling of the ankles, hands, or feet Trouble breathing Side effects that usually do not require medical attention (report to your care team if they continue or are bothersome): Diarrhea This list may not describe all possible side effects. Call your doctor for medical advice about side effects. You may report side effects to FDA at 1-800-FDA-1088. Where should I keep my medication? Keep out of the reach of children. Store at room temperature between 15 and 30 degrees C (59 and 85 degrees F). Protect from light. Throw away any unused medication after the expiration date. NOTE: This sheet is a summary. It may not cover all possible information. If you have questions about this medicine, talk to your doctor, pharmacist, or health care provider.  2024 Elsevier/Gold Standard (2021-08-01 00:00:00)

## 2024-10-22 ENCOUNTER — Inpatient Hospital Stay: Attending: Oncology

## 2024-10-22 VITALS — BP 147/74 | HR 73 | Temp 98.4°F | Resp 18

## 2024-10-22 DIAGNOSIS — E538 Deficiency of other specified B group vitamins: Secondary | ICD-10-CM

## 2024-10-22 MED ORDER — CYANOCOBALAMIN 1000 MCG/ML IJ SOLN
1000.0000 ug | Freq: Once | INTRAMUSCULAR | Status: AC
  Administered 2024-10-22: 1000 ug via INTRAMUSCULAR
  Filled 2024-10-22: qty 1

## 2024-10-22 NOTE — Patient Instructions (Signed)
Vitamin B12 and Folate Test Why am I having this test? Vitamin B12 and folate (folic acid) are B vitamins needed to make red blood cells and keep your nervous system healthy. Vitamin B12 is in foods such as meats, eggs, dairy products, and fish. Folate is in fruits, beans, and leafy green vegetables. Some foods, such as whole grains, bread, and cereals have vitamin B12 added to them (are fortified). You may not have enough of these B vitamins (have a deficiency) if your diet lacks these vitamins. Low levels can also be caused by diseases or having had surgeries on your stomach or small intestine that interfere with your ability to absorb the vitamins from your food. You may have a vitamin B12 and folate test if: You have symptoms of vitamin B12 or folate deficiency, such as tiredness (fatigue), headache, confusion, poor balance, or tingling and numbness in your hands and feet. You are pregnant or breastfeeding. Women who are pregnant or breastfeeding need more folate and may need to take dietary supplements. Your red blood cell count is low (anemia). You are an older person and have mental confusion. You have a disease or condition that may lead to a deficiency of these B vitamins. What is being tested? This test measures the amount of vitamin B12 and folate in your blood. The tests for vitamin B12 and folate may be done together or separately. What kind of sample is taken?  A blood sample is required for this test. It is usually collected by inserting a needle into a blood vessel. How do I prepare for this test? Follow instructions from your health care provider about eating and drinking before the test. Tell a health care provider about: All medicines you are taking, including vitamins, herbs, eye drops, creams, and over-the-counter medicines. Any medical conditions you have. Whether you are pregnant or may be pregnant. How often you drink alcohol. How are the results reported? Your test  results will be reported as values that identify the amount of vitamin B12 and folate in your blood. Your health care provider will compare your results to normal ranges that were established after testing a large group of people (reference ranges). Reference ranges may vary among labs and hospitals. For this test, common reference ranges are: Vitamin B12: 160-950 pg/mL or 118-701 pmol/L (SI units). Folate: 5-25 ng/mL or 11-57 nmol/L (SI units). What do the results mean? Results within the reference range are considered normal. Vitamin B12 or folate levels that are lower than the reference range may be caused by: Poor nutrition or eating a vegetarian or vegan diet that does not include any foods that come from animals. Having alcoholism. Having certain diseases that make it hard to absorb vitamin B12. These diseases include Crohn's disease, chronic pancreatitis, and cystic fibrosis. Taking certain medicines. Having had surgeries on your stomach or small intestine. High levels of vitamin B12 are rare, but they may happen if you have: Cancer. Liver disease. High levels of folate may happen if: You have anemia. You are vegetarian. You have had a recent blood transfusion. Talk with your health care provider about what your results mean. Questions to ask your health care provider Ask your health care provider, or the department that is doing the test: When will my results be ready? How will I get my results? What are my treatment options? What other tests do I need? What are my next steps? Summary Vitamin B12 and folate (folic acid) are both B vitamins that are needed to  make red blood cells and to keep your nervous system healthy. You may not have enough B vitamins in your body if you do not get enough in your diet or if you have a disease that makes it hard to absorb vitamin B12. This test measures the amount of vitamin B12 and folate in your blood. A blood sample is required for the  test. Talk with your health care provider about what your results mean. This information is not intended to replace advice given to you by your health care provider. Make sure you discuss any questions you have with your health care provider. Document Revised: 07/14/2021 Document Reviewed: 07/14/2021 Elsevier Patient Education  2024 [employer].

## 2024-11-23 ENCOUNTER — Inpatient Hospital Stay: Attending: Oncology

## 2024-11-23 VITALS — BP 157/81 | HR 75 | Temp 97.8°F | Resp 20 | Ht 63.5 in | Wt 140.1 lb

## 2024-11-23 DIAGNOSIS — D510 Vitamin B12 deficiency anemia due to intrinsic factor deficiency: Secondary | ICD-10-CM | POA: Insufficient documentation

## 2024-11-23 DIAGNOSIS — E538 Deficiency of other specified B group vitamins: Secondary | ICD-10-CM

## 2024-11-23 MED ORDER — CYANOCOBALAMIN 1000 MCG/ML IJ SOLN
1000.0000 ug | Freq: Once | INTRAMUSCULAR | Status: AC
  Administered 2024-11-23: 1000 ug via INTRAMUSCULAR
  Filled 2024-11-23: qty 1

## 2024-12-21 ENCOUNTER — Inpatient Hospital Stay: Attending: Oncology

## 2024-12-21 VITALS — BP 148/86 | HR 54 | Temp 98.1°F | Resp 20

## 2024-12-21 DIAGNOSIS — E538 Deficiency of other specified B group vitamins: Secondary | ICD-10-CM

## 2024-12-21 MED ORDER — CYANOCOBALAMIN 1000 MCG/ML IJ SOLN
1000.0000 ug | Freq: Once | INTRAMUSCULAR | Status: AC
  Administered 2024-12-21: 1000 ug via INTRAMUSCULAR
  Filled 2024-12-21: qty 1

## 2024-12-21 NOTE — Patient Instructions (Signed)
 Vitamin B12 Deficiency Vitamin B12 deficiency means that your body does not have enough vitamin B12. The body needs this important vitamin: To make red blood cells. To make genes (DNA). To help the nerves work. If you do not have enough vitamin B12 in your body, you can have health problems, such as not having enough red blood cells in the blood (anemia). What are the causes? Not eating enough foods that contain vitamin B12. Not being able to take in (absorb) vitamin B12 from the food that you eat. Certain diseases. A condition in which the body does not make enough of a certain protein. This results in your body not taking in enough vitamin B12. Having a surgery in which part of the stomach or small intestine is taken out. Taking medicines that make it hard for the body to take in vitamin B12. These include: Heartburn medicines. Some medicines that are used to treat diabetes. What increases the risk? Being an older adult. Eating a vegetarian or vegan diet that does not include any foods that come from animals. Not eating enough foods that contain vitamin B12 while you are pregnant. Taking certain medicines. Having alcoholism. What are the signs or symptoms? In some cases, there are no symptoms. If the condition leads to too few blood cells or nerve damage, symptoms can occur, such as: Feeling weak or tired. Not being hungry. Losing feeling (numbness) or tingling in your hands and feet. Redness and burning of the tongue. Feeling sad (depressed). Confusion or memory problems. Trouble walking. If anemia is very bad, symptoms can include: Being short of breath. Being dizzy. Having a very fast heartbeat. How is this treated? Changing the way you eat and drink, such as: Eating more foods that contain vitamin B12. Drinking little or no alcohol. Getting vitamin B12 shots. Taking vitamin B12 supplements by mouth (orally). Your doctor will tell you the dose that is best for you. Follow  these instructions at home: Eating and drinking  Eat foods that come from animals and have a lot of vitamin B12 in them. These include: Meats and poultry. This includes beef, pork, chicken, [place], and organ meats, such as liver. Seafood, such as clams, rainbow trout, salmon, tuna, and haddock. Eggs. Dairy foods such as milk, yogurt, and cheese. Eat breakfast cereals that have vitamin B12 added to them (are fortified). Check the label. The items listed above may not be a complete list of foods and beverages you can eat and drink. Contact a dietitian for more information. Alcohol use Do not drink alcohol if: Your doctor tells you not to drink. You are pregnant, may be pregnant, or are planning to become pregnant. If you drink alcohol: Limit how much you have to: 0-1 drink a day for women. 0-2 drinks a day for men. Know how much alcohol is in your drink. In the [place] one drink equals one 12 oz bottle of beer (355 mL), one 5 oz glass of wine (148 mL), or one 1 oz glass of hard liquor (44 mL). General instructions Get any vitamin B12 shots if told by your doctor. Take supplements only as told by your doctor. Follow the directions. Keep all follow-up visits. Contact a doctor if: Your symptoms come back. Your symptoms get worse or do not get better with treatment. Get help right away if: You have trouble breathing. You have a very fast heartbeat. You have chest pain. You get dizzy. You faint. These symptoms may be an emergency. Get help right away. Call 911.  Do not wait to see if the symptoms will go away. Do not drive yourself to the hospital. Summary Vitamin B12 deficiency means that your body is not getting enough of the vitamin. In some cases, there are no symptoms of this condition. Treatment may include making a change in the way you eat and drink, getting shots, or taking supplements. Eat foods that have vitamin B12 in them. This information is not intended to replace advice  given to you by your health care provider. Make sure you discuss any questions you have with your health care provider. Document Revised: 07/14/2021 Document Reviewed: 07/14/2021 Elsevier Patient Education  2024 [employer].

## 2025-01-22 ENCOUNTER — Inpatient Hospital Stay

## 2025-02-19 ENCOUNTER — Inpatient Hospital Stay

## 2025-03-22 ENCOUNTER — Inpatient Hospital Stay

## 2025-04-21 ENCOUNTER — Inpatient Hospital Stay

## 2025-05-17 ENCOUNTER — Inpatient Hospital Stay

## 2025-06-22 ENCOUNTER — Ambulatory Visit: Admitting: Oncology

## 2025-06-22 ENCOUNTER — Inpatient Hospital Stay

## 2025-06-22 ENCOUNTER — Other Ambulatory Visit
